# Patient Record
Sex: Male | Born: 1955 | Race: White | Hispanic: No | Marital: Married | Smoking: Never smoker
Health system: Southern US, Community
[De-identification: ages and names within clinical notes are randomized; demographics above are authoritative.]

## PROBLEM LIST (undated history)

## (undated) DIAGNOSIS — Z86010 Personal history of colonic polyps: Secondary | ICD-10-CM

## (undated) DIAGNOSIS — Q211 Atrial septal defect: Secondary | ICD-10-CM

## (undated) DIAGNOSIS — F329 Major depressive disorder, single episode, unspecified: Secondary | ICD-10-CM

## (undated) DIAGNOSIS — D72819 Decreased white blood cell count, unspecified: Secondary | ICD-10-CM

## (undated) DIAGNOSIS — F32A Depression, unspecified: Secondary | ICD-10-CM

## (undated) DIAGNOSIS — Q2112 Patent foramen ovale: Secondary | ICD-10-CM

## (undated) DIAGNOSIS — I639 Cerebral infarction, unspecified: Secondary | ICD-10-CM

## (undated) DIAGNOSIS — T7840XA Allergy, unspecified, initial encounter: Secondary | ICD-10-CM

## (undated) HISTORY — DX: Patent foramen ovale: Q21.12

## (undated) HISTORY — DX: Allergy, unspecified, initial encounter: T78.40XA

## (undated) HISTORY — PX: COLONOSCOPY: SHX174

## (undated) HISTORY — DX: Atrial septal defect: Q21.1

## (undated) HISTORY — DX: Decreased white blood cell count, unspecified: D72.819

## (undated) HISTORY — DX: Depression, unspecified: F32.A

## (undated) HISTORY — DX: Cerebral infarction, unspecified: I63.9

## (undated) HISTORY — DX: Personal history of colonic polyps: Z86.010

## (undated) HISTORY — DX: Major depressive disorder, single episode, unspecified: F32.9

---

## 2002-12-19 ENCOUNTER — Encounter: Payer: Self-pay | Admitting: Internal Medicine

## 2004-02-18 ENCOUNTER — Encounter: Payer: Self-pay | Admitting: Internal Medicine

## 2005-07-14 ENCOUNTER — Ambulatory Visit: Payer: Self-pay | Admitting: Internal Medicine

## 2005-07-22 ENCOUNTER — Ambulatory Visit: Payer: Self-pay | Admitting: Internal Medicine

## 2007-02-01 DIAGNOSIS — F339 Major depressive disorder, recurrent, unspecified: Secondary | ICD-10-CM

## 2007-06-08 ENCOUNTER — Ambulatory Visit: Payer: Self-pay | Admitting: Internal Medicine

## 2007-06-08 LAB — CONVERTED CEMR LAB
Albumin: 4.3 g/dL (ref 3.5–5.2)
Alkaline Phosphatase: 54 units/L (ref 39–117)
BUN: 16 mg/dL (ref 6–23)
Basophils Absolute: 0 10*3/uL (ref 0.0–0.1)
Cholesterol: 185 mg/dL (ref 0–200)
Creatinine, Ser: 1.3 mg/dL (ref 0.4–1.5)
Eosinophils Absolute: 0.2 10*3/uL (ref 0.0–0.6)
GFR calc Af Amer: 74 mL/min
GFR calc non Af Amer: 61 mL/min
Glucose, Urine, Semiquant: 100
HDL: 44.9 mg/dL (ref >39.0)
Hemoglobin: 14.5 g/dL (ref 13.0–17.0)
Ketones, urine, test strip: NEGATIVE
LDL Cholesterol: 130 mg/dL — ABNORMAL HIGH (ref 0–99)
MCHC: 35.2 g/dL (ref 30.0–36.0)
MCV: 91 fL (ref 78.0–100.0)
Monocytes Absolute: 0.3 10*3/uL (ref 0.2–0.7)
Monocytes Relative: 7.8 % (ref 3.0–11.0)
Neutrophils Relative %: 65.1 % (ref 43.0–77.0)
PSA: 0.58 ng/mL (ref 0.10–4.00)
Potassium: 5.3 meq/L — ABNORMAL HIGH (ref 3.5–5.1)
RBC: 4.53 M/uL (ref 4.22–5.81)
RDW: 12.1 % (ref 11.5–14.6)
Sodium: 141 meq/L (ref 135–145)
TSH: 1.01 microintl units/mL (ref 0.35–5.50)
Total Bilirubin: 1.2 mg/dL (ref 0.3–1.2)
Total CHOL/HDL Ratio: 4.1
Urobilinogen, UA: 1
pH: 6

## 2007-06-24 ENCOUNTER — Ambulatory Visit: Payer: Self-pay | Admitting: Internal Medicine

## 2008-08-01 ENCOUNTER — Telehealth: Payer: Self-pay | Admitting: Internal Medicine

## 2008-09-18 ENCOUNTER — Ambulatory Visit: Payer: Self-pay | Admitting: Internal Medicine

## 2008-09-18 LAB — CONVERTED CEMR LAB
Albumin: 4 g/dL (ref 3.5–5.2)
Basophils Absolute: 0 10*3/uL (ref 0.0–0.1)
Basophils Relative: 0.8 % (ref 0.0–3.0)
Bilirubin, Direct: 0.1 mg/dL (ref 0.0–0.3)
Blood in Urine, dipstick: NEGATIVE
CO2: 30 meq/L (ref 19–32)
Calcium: 9.1 mg/dL (ref 8.4–10.5)
Chloride: 111 meq/L (ref 96–112)
Cholesterol: 181 mg/dL (ref 0–200)
Creatinine, Ser: 1.2 mg/dL (ref 0.4–1.5)
Eosinophils Absolute: 0.3 10*3/uL (ref 0.0–0.7)
Glucose, Bld: 97 mg/dL (ref 70–99)
Glucose, Urine, Semiquant: NEGATIVE
HDL: 41 mg/dL (ref >39.00)
MCHC: 35.3 g/dL (ref 30.0–36.0)
MCV: 92.4 fL (ref 78.0–100.0)
Monocytes Absolute: 0.3 10*3/uL (ref 0.1–1.0)
Neutro Abs: 2 10*3/uL (ref 1.4–7.7)
Neutrophils Relative %: 59.4 % (ref 43.0–77.0)
Nitrite: NEGATIVE
PSA: 0.55 ng/mL (ref 0.10–4.00)
RBC: 4.35 M/uL (ref 4.22–5.81)
RDW: 12 % (ref 11.5–14.6)
Specific Gravity, Urine: 1.025
Total CHOL/HDL Ratio: 4
Total Protein: 6.6 g/dL (ref 6.0–8.3)
Triglycerides: 62 mg/dL (ref 0.0–149.0)
pH: 6

## 2008-09-25 ENCOUNTER — Ambulatory Visit: Payer: Self-pay | Admitting: Internal Medicine

## 2009-11-07 ENCOUNTER — Telehealth: Payer: Self-pay | Admitting: Internal Medicine

## 2010-04-16 ENCOUNTER — Ambulatory Visit: Payer: Self-pay | Admitting: Internal Medicine

## 2010-04-16 LAB — CONVERTED CEMR LAB
ALT: 13 units/L (ref 0–53)
AST: 17 units/L (ref 0–37)
Albumin: 4.2 g/dL (ref 3.5–5.2)
Alkaline Phosphatase: 54 units/L (ref 39–117)
Basophils Relative: 1.3 % (ref 0.0–3.0)
Bilirubin Urine: NEGATIVE
Calcium: 9.2 mg/dL (ref 8.4–10.5)
Cholesterol: 167 mg/dL (ref 0–200)
Creatinine, Ser: 1.3 mg/dL (ref 0.40–1.50)
Eosinophils Absolute: 0.3 10*3/uL (ref 0.0–0.7)
Glucose, Urine, Semiquant: NEGATIVE
HDL: 45 mg/dL (ref >39)
MCHC: 35.1 g/dL (ref 30.0–36.0)
MCV: 93.7 fL (ref 78.0–100.0)
Monocytes Absolute: 0.3 10*3/uL (ref 0.1–1.0)
Neutrophils Relative %: 63.2 % (ref 43.0–77.0)
RBC: 3.98 M/uL — ABNORMAL LOW (ref 4.22–5.81)
RDW: 12.9 % (ref 11.5–14.6)
Sodium: 143 meq/L (ref 135–145)
Specific Gravity, Urine: 1.02
Total Bilirubin: 0.5 mg/dL (ref 0.3–1.2)
Total Protein: 6.2 g/dL (ref 6.0–8.3)
Triglycerides: 65 mg/dL (ref <150)
WBC Urine, dipstick: NEGATIVE
pH: 6

## 2010-04-23 ENCOUNTER — Ambulatory Visit: Payer: Self-pay | Admitting: Internal Medicine

## 2010-06-24 NOTE — Progress Notes (Signed)
Summary: paxil--needs ov  Phone Note Refill Request Call back at [phone] Message from:  spouse--live call  Refills Requested: Medication #1:  PAXIL CR 25 MG TB24 Take 1 tablet by mouth once a day send to [facility] order.  Initial call taken by: Warnell Forester,  November 07, 2009 9:13 AM  Follow-up for Phone Call        see Rx.Patient wife notified.   Knows he needs ov for additional. Follow-up by: Gladis Riffle, RN,  November 07, 2009 11:22 AM    New/Updated Medications: PAXIL CR 25 MG TB24 (PAROXETINE HCL) Take 1 tablet by mouth once a day--NEEDS OFFICE VISIT FOR ADDITIONAL REFILLS Prescriptions: PAXIL CR 25 MG TB24 (PAROXETINE HCL) Take 1 tablet by mouth once a day--NEEDS OFFICE VISIT FOR ADDITIONAL REFILLS  #90 x 1   Entered by:   Gladis Riffle, RN   Authorized by:   Birdie Sons MD   Signed by:   Gladis Riffle, RN on 11/07/2009   Method used:   Faxed to ...       [address] [facility] (mail-order)       [address]       [place], [place]  [address]       Ph: [phone]       Fax: [phone]   RxID:   [id number]

## 2010-06-24 NOTE — Assessment & Plan Note (Signed)
Summary: cpx//ccm   Vital Signs:  Patient profile:   55 year old male Height:      68 inches Weight:      164 pounds BMI:     25.03 Temp:     98.4 degrees F oral Pulse rate:   72 / minute Pulse rhythm:   regular BP sitting:   118 / 78  (left arm) Cuff size:   regular  Vitals Entered By: Alfred Levins, CMA (April 23, 2010 9:50 AM) CC: cpx   CC:  cpx.  History of Present Illness: cpx  Current Problems (verified): 1)  Preventive Health Care  (ICD-V70.0) 2)  Depression  (ICD-311)  Current Medications (verified): 1)  Paxil Cr 25 Mg Tb24 (Paroxetine Hcl) .... Take 1 Tablet By Mouth Once A Day--Needs Office Visit For Additional Refills 2)  Loratadine 10 Mg  Tabs (Loratadine) .... Once Daily 3)  Alprazolam 0.25 Mg Tabs (Alprazolam) .... Take 1 Tablet By Mouth Once A Day As Needed  Allergies (verified): No Known Drug Allergies  Past History:  Past Medical History: Last updated: 06/24/2007 Allergic rhinitis Depression chronic leukopenia.   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 HEENT exam atraumatic, normocephalic symmetric her muscles are intact. Neck is supple without lymphadenopathy or thyromegaly. Chest clear to auscultation. Cardiac exam S1-S2 are regular. Abdominal exam active bowel sounds, soft, nontender. No hepatosplenomegaly. Extremities no clubbing cyanosis or edema. Peripheral pulses are normal. Neurologic exam is alert and oriented without any motor or sensory deficits.   Impression & Recommendations:  Problem # 1:  PREVENTIVE HEALTH CARE (ICD-V70.0) health maintenance up-to-date. I've advised him to participate in regular aerobic exercise. Laboratory work reviewed with him. Colonoscopy up-to-date. That was reviewed. He'll see him in one year. Prescription refills.  Complete Medication List: 1)  Paxil Cr 25 Mg Tb24 (Paroxetine hcl) .... Take 1 tablet by mouth once a day--needs office visit for additional refills 2)  Loratadine 10 Mg Tabs (Loratadine) .... Once daily 3)  Alprazolam 0.5 Mg Tabs (Alprazolam) .... Take 1 tablet by mouth once a day as needed for situational anxiety Prescriptions: ALPRAZOLAM 0.5 MG  TABS (ALPRAZOLAM) Take 1 tablet by mouth once a day as needed for situational anxiety  #30 x 1   Entered and Authorized by:   Birdie Sons MD   Signed by:   Birdie Sons MD on 04/23/2010   Method used:   Print then Give to Patient   RxID:   [id number] PAXIL CR 25 MG TB24 (PAROXETINE HCL) Take 1 tablet by mouth once a day--NEEDS OFFICE VISIT FOR ADDITIONAL REFILLS  #90 x 3   Entered by:   Alfred Levins, CMA   Authorized by:  Birdie Sons MD   Signed by:   Alfred Levins, CMA on 04/23/2010   Method used:   Electronically to        [facility] [address] (mail-order)       [facility]       [address]       [place], [place]  [address]       Ph: [phone]       Fax: [phone]    RxID:   [id number]    Orders Added: 1)  Est. Patient 40-64 years [[id number]]  Appended Document: cpx//ccm    Prescriptions: PAXIL CR 25 MG TB24 (PAROXETINE HCL) Take 1 tablet by mouth once a day--NEEDS OFFICE VISIT FOR ADDITIONAL REFILLS  #90 x 3   Entered by:   Alfred Levins, CMA   Authorized by:   Birdie Sons MD   Signed by:   Alfred Levins, CMA on 04/23/2010   Method used:   Faxed to ...       [address] [facility] (mail-order)       [address]       [place], [place]  [address]       Ph: [phone]       Fax: [phone]   RxID:   [id number]    pt no longer with [facility], faxed to correct one

## 2011-03-21 ENCOUNTER — Inpatient Hospital Stay (HOSPITAL_COMMUNITY)
Admission: EM | Admit: 2011-03-21 | Discharge: 2011-03-23 | DRG: 065 | Disposition: A | Discharge status: Home / Self Care | Payer: Managed Care, Other (non HMO) | Attending: Internal Medicine | Admitting: Internal Medicine

## 2011-03-21 ENCOUNTER — Encounter: Payer: Self-pay | Admitting: Internal Medicine

## 2011-03-21 ENCOUNTER — Encounter: Payer: Self-pay | Admitting: Family Medicine

## 2011-03-21 ENCOUNTER — Emergency Department (HOSPITAL_COMMUNITY): Payer: Managed Care, Other (non HMO)

## 2011-03-21 ENCOUNTER — Ambulatory Visit (INDEPENDENT_AMBULATORY_CARE_PROVIDER_SITE_OTHER): Payer: Managed Care, Other (non HMO) | Admitting: Family Medicine

## 2011-03-21 ENCOUNTER — Observation Stay (HOSPITAL_COMMUNITY): Payer: Managed Care, Other (non HMO)

## 2011-03-21 DIAGNOSIS — E785 Hyperlipidemia, unspecified: Secondary | ICD-10-CM | POA: Diagnosis present

## 2011-03-21 DIAGNOSIS — I6340 Cerebral infarction due to embolism of unspecified cerebral artery: Principal | ICD-10-CM | POA: Diagnosis present

## 2011-03-21 DIAGNOSIS — Q211 Atrial septal defect: Secondary | ICD-10-CM

## 2011-03-21 DIAGNOSIS — Z79899 Other long term (current) drug therapy: Secondary | ICD-10-CM

## 2011-03-21 DIAGNOSIS — T7840XA Allergy, unspecified, initial encounter: Secondary | ICD-10-CM | POA: Diagnosis present

## 2011-03-21 DIAGNOSIS — Q2111 Secundum atrial septal defect: Secondary | ICD-10-CM

## 2011-03-21 DIAGNOSIS — Z823 Family history of stroke: Secondary | ICD-10-CM

## 2011-03-21 DIAGNOSIS — X58XXXA Exposure to other specified factors, initial encounter: Secondary | ICD-10-CM

## 2011-03-21 DIAGNOSIS — R55 Syncope and collapse: Secondary | ICD-10-CM

## 2011-03-21 DIAGNOSIS — Z23 Encounter for immunization: Secondary | ICD-10-CM

## 2011-03-21 DIAGNOSIS — F410 Panic disorder [episodic paroxysmal anxiety] without agoraphobia: Secondary | ICD-10-CM | POA: Diagnosis present

## 2011-03-21 LAB — DIFFERENTIAL
Basophils Absolute: 0 10*3/uL (ref 0.0–0.1)
Eosinophils Relative: 5 % (ref 0–5)
Lymphocytes Relative: 22 % (ref 12–46)
Lymphs Abs: 1 10*3/uL (ref 0.7–4.0)
Monocytes Absolute: 0.4 10*3/uL (ref 0.1–1.0)
Monocytes Relative: 8 % (ref 3–12)
Neutro Abs: 3.1 10*3/uL (ref 1.7–7.7)

## 2011-03-21 LAB — RAPID URINE DRUG SCREEN, HOSP PERFORMED
Amphetamines: NOT DETECTED
Barbiturates: NOT DETECTED
Benzodiazepines: NOT DETECTED
Tetrahydrocannabinol: NOT DETECTED

## 2011-03-21 LAB — URINALYSIS, ROUTINE W REFLEX MICROSCOPIC
Glucose, UA: NEGATIVE mg/dL
Ketones, ur: NEGATIVE mg/dL
Leukocytes, UA: NEGATIVE
Nitrite: NEGATIVE
Protein, ur: NEGATIVE mg/dL

## 2011-03-21 LAB — COMPREHENSIVE METABOLIC PANEL
ALT: 17 U/L (ref 0–53)
CO2: 26 mEq/L (ref 19–32)
Calcium: 9.6 mg/dL (ref 8.4–10.5)
GFR calc Af Amer: >90 mL/min (ref >90)
GFR calc non Af Amer: 79 mL/min — ABNORMAL LOW (ref >90)
Glucose, Bld: 86 mg/dL (ref 70–99)
Sodium: 139 mEq/L (ref 135–145)

## 2011-03-21 LAB — CK TOTAL AND CKMB (NOT AT ARMC): CK, MB: 2.7 ng/mL (ref 0.3–4.0)

## 2011-03-21 LAB — CBC
Platelets: 240 10*3/uL (ref 150–400)
RDW: 12.5 % (ref 11.5–15.5)
WBC: 4.7 10*3/uL (ref 4.0–10.5)

## 2011-03-21 LAB — HEMOGLOBIN A1C: Hgb A1c MFr Bld: 5.1 % (ref <5.7)

## 2011-03-21 LAB — POCT I-STAT TROPONIN I

## 2011-03-21 NOTE — Assessment & Plan Note (Addendum)
Vs. TIA vs CVA vs SZ.  D/w pt.  I am unclear about the source and he need ER eval. I called [facility] ER and they are aware of pending arrival. >25 min spent with face to face with patient, >50% counseling and/or coordinating care.

## 2011-03-21 NOTE — Patient Instructions (Signed)
Go to the [facility] ER.  Don't drive yourself.

## 2011-03-21 NOTE — Progress Notes (Signed)
Was in the shower yesterday.  He remembers being in the shower and trying to wash his hair.  He fell, had trouble getting up, fell again in shower.  He remembers having a severe headache. Got out of shower and dried off, then went to get ibuprofen for HA.  He was fatigued.  He laid down, fell asleep.  When he woke, he had trouble remembering events from the shower.  "I didn't know if it was a dream."  Went to work yesterday.  Noted that his writing was changed yesterday.  He couldn't write clear letters.    Today's he's sore on the L side of his body.  His writing is back to baseline per his report.    Father had a CVA/TIA.  Meds, vitals, and allergies reviewed.   ROS: See HPI.  Otherwise, noncontributory.  Past Medical History  Diagnosis Date  . Allergic rhinitis   . Depression   . Leukopenia     chronic   Meds: xanax, claritin and paxil.    NKDA  GEN: nad, alert and oriented HEENT: mucous membranes moist NECK: supple w/o LA CV: rrr. PULM: ctab, no inc wob ABD: soft, +bs EXT: no edema SKIN: no acute rash CN 2-12 wnl B, S/S/DTR wnl

## 2011-03-22 ENCOUNTER — Inpatient Hospital Stay (HOSPITAL_COMMUNITY): Payer: Managed Care, Other (non HMO)

## 2011-03-22 LAB — BASIC METABOLIC PANEL
BUN: 16 mg/dL (ref 6–23)
CO2: 27 mEq/L (ref 19–32)
Calcium: 9.3 mg/dL (ref 8.4–10.5)
GFR calc non Af Amer: 72 mL/min — ABNORMAL LOW (ref >90)
Glucose, Bld: 94 mg/dL (ref 70–99)

## 2011-03-22 LAB — CBC
HCT: 36.9 % — ABNORMAL LOW (ref 39.0–52.0)
Hemoglobin: 13.1 g/dL (ref 13.0–17.0)
MCH: 31.3 pg (ref 26.0–34.0)
MCHC: 35.5 g/dL (ref 30.0–36.0)
MCV: 88.1 fL (ref 78.0–100.0)

## 2011-03-22 LAB — DIFFERENTIAL
Lymphocytes Relative: 27 % (ref 12–46)
Lymphs Abs: 1.2 10*3/uL (ref 0.7–4.0)
Monocytes Absolute: 0.4 10*3/uL (ref 0.1–1.0)
Monocytes Relative: 9 % (ref 3–12)
Neutro Abs: 2.3 10*3/uL (ref 1.7–7.7)

## 2011-03-22 LAB — LIPID PANEL
LDL Cholesterol: 116 mg/dL — ABNORMAL HIGH (ref 0–99)
VLDL: 26 mg/dL (ref 0–40)

## 2011-03-22 LAB — CK TOTAL AND CKMB (NOT AT ARMC): CK, MB: 2.7 ng/mL (ref 0.3–4.0)

## 2011-03-22 LAB — TSH: TSH: 2.053 u[IU]/mL (ref 0.350–4.500)

## 2011-03-22 LAB — C-REACTIVE PROTEIN: CRP: 0.09 mg/dL — ABNORMAL LOW (ref <0.60)

## 2011-03-22 LAB — GLUCOSE, CAPILLARY: Glucose-Capillary: 89 mg/dL (ref 70–99)

## 2011-03-22 MED ORDER — IOHEXOL 350 MG/ML SOLN
50.0000 mL | Freq: Once | INTRAVENOUS | Status: AC | PRN
  Administered 2011-03-22: 50 mL via INTRAVENOUS

## 2011-03-22 NOTE — H&P (Signed)
NAMEMarland Kitchen  Ryan Harris, Ryan Harris NO.:  [id number]  MEDICAL RECORD NO.:  [id number]  LOCATION:  1839                         FACILITY:  [facility]  PHYSICIAN:  Osvaldo Shipper, MD     DATE OF BIRTH:  Oct 03, 1955  DATE OF ADMISSION:  03/21/2011 DATE OF DISCHARGE:                             HISTORY & PHYSICAL   PRIMARY CARE PHYSICIAN:  Valetta Mole. Swords, MD  ADMISSION DIAGNOSES: 1. Right brain stroke, likely embolic. 2. History of anxiety disorder and panic attacks. 3. Allergies.  CHIEF COMPLAINT:  Headache and falls yesterday.  HISTORY OF PRESENT ILLNESS:  The patient is a 55 year old Caucasian male with no real medical problems who was in his usual state of health until yesterday morning when he got up to go to work, started having headache. He went into shower and then he apparently had a fall.  He got up and then he had another fall.  He felt extremely fatigued and tired.  Took ibuprofen for his headache and went to bed and he must have dozed off according to him.  He woke up and could not really recall if the falls really happened or not.  He subsequently went on to go to work and he found that he was not able to write properly.  His handwriting was very scribbly.  He is left handed.  The rest of the day went okay.  He came back home.  He slept in the night.  He woke up this morning, mentioned all this to his wife who recommended that he go to see his doctor.  He went to the [facility] who promptly sent him to the emergency department for further evaluation.  Currently, he denies any headaches. He denies any chest pain, shortness of breath, fever, chills, nausea, vomiting.  He did not have any weakness on one side or the other.  He denies any tingling or numbness.  His handwriting is back to normal.  He denies any weight loss.  He denies any diarrhea or hair loss.  He denies any seizure-type activity.  He has never had similar symptoms in the past.  MEDICATIONS AT  HOME: 1. Claritin 10 mg daily. 2. Paxil, he tells me he is on 25 mg daily. 3. He takes ibuprofen occasionally for headaches. 4. He is not on aspirin.  ALLERGIES:  No known drug allergies.  PAST MEDICAL HISTORY:  Positive for anxiety with panic attacks, and history of allergies.  PAST SURGICAL HISTORY:  Unremarkable.  SOCIAL HISTORY:  He lives in [place] with his family.  He owns a sand and gravel company.  He denies any smoking use.  Social alcohol intake.  No illicit drug use.  FAMILY HISTORY:  His mother had TIA and hypertension, but she otherwise is alive and well.  Father died of multiple strokes.  He has 2 brothers and 2 sisters, almost of whom have high blood pressure.  REVIEW OF SYSTEMS:  GENERAL SYSTEM:  Positive for weakness, malaise. HEENT:  Unremarkable at this time.  CARDIOVASCULAR:  Unremarkable. RESPIRATORY:  Unremarkable.  GI:  Unremarkable.  GU:  Unremarkable. NEUROLOGIC:  As in HPI.  PSYCHIATRIC:  Unremarkable.  DERMATOLOGIC: Unremarkable.  Other systems  were reviewed and found to be negative.  PHYSICAL EXAMINATION:  VITAL SIGNS:  Temperature 97.6, blood pressure initially when he came in was 159/90, currently 136/88, heart rate 68, respiratory rate 20, saturation 100% on room air. GENERAL:  This is a thin white male, in no distress. HEENT:  Head is normocephalic, atraumatic.  Pupils are equal and reacting.  No pallor.  No icterus.  Oral mucous membrane is moist.  No oral lesions are noted. NECK:  Soft and supple.  No thyromegaly is appreciated. LUNGS:  Clear to auscultation bilaterally with no wheezing, rales, or rhonchi. CARDIOVASCULAR:  S1 and S2 is normal, regular.  No S3, S4, rubs, murmurs, or bruits. ABDOMEN:  Soft, nontender, nondistended.  Bowel sounds present.  No masses or organomegaly is appreciated.  No cervical, supraclavicular, inguinal lymphadenopathy is present. GU:  Deferred. MUSCULOSKELETAL:  Normal muscle mass and tone. NEUROLOGIC:   He is alert and oriented x3.  No cranial nerve deficits. No motor strength deficits.  No sensory deficits.  No cerebellar signs abnormal was noted.  No pronator drift.  Finger-to-nose was normal. Gait was not assessed.  However, he did tell me that he has had no problems with ambulation today. SKIN:  Does not reveal any rashes.  He does have some minor bruising from the fall, but nothing significant.  LAB DATA:  His CBC is unremarkable.  Coags are normal.  Electrolytes are normal.  HB A1c is 5.1.  His CK is 237.  Urine drug screen was negative. Alcohol less than 11.  UA was unremarkable.  Cardiac enzymes were negative.  Otherwise, he had multiple testing done. 1. CT of the head which was nonacute. 2. Chest x-ray which was again nothing active. 3. MRI of the brain showed multiple small acute infarct in the right     MCA and the right PCA territories.  No mass effect.  MRA of the     head that was done which showed a fetal-type PCA origins     bilaterally, explaining the acute ischemic findings in both right     MCA and PCA territories on the basis of right carotid     thromboembolic disease.  No anterior circulation stenosis.  No     right MCA or PCA major branch occlusion.  Diminutive posterior     circulation was seen.  Stenosis or fenestration in the distal left     vertebral artery beyond the origin of the left PICA.  He had an EKG     done which shows a sinus bradycardia at 52 with normal axis,     intervals appeared to be in the normal range.  No concerning ST     changes or T-wave changes.  No Q-waves are noted on this EKG. 4. He has also had an echocardiogram, the results of which are     available and it showed an EF of 60-65% without any     wall motion abnormalities.  No apical clot was mentioned on this 2D     echocardiogram.  ASSESSMENT:  This is a 55 year old Caucasian male with no medical problems who presents after headache and falls yesterday and is found to have  stroke on the right side of the brain, thought to be embolic stroke, could be from carotid origin, could be cardiac as well.  PLAN: 1. Right brain stroke.  Neurology has seen the patient.  Workup is in     progress.  Aspirin will be given.  Neurology will decide  on a TEE     tomorrow.  The patient does not have any focal deficits at this     time.  Other workup has been recommended by Neurology which we will     order. 2. Lipid panel will be checked in the morning as well. 3. Continue with Paxil for his anxiety disorder.  Continue with     Claritin for his history of allergies. 4. DVT prophylaxis will be initiated.  Further management decisions will depend on results of further testing and the patient's response to treatment.  Osvaldo Shipper, MD     GK/MEDQ  D:  03/21/2011  T:  03/21/2011  Job:  [id number]  cc:   Valetta Mole. Swords, MD  Electronically Signed by Osvaldo Shipper MD on 03/22/2011 10:13:56 PM

## 2011-03-23 DIAGNOSIS — I6789 Other cerebrovascular disease: Secondary | ICD-10-CM

## 2011-03-23 DIAGNOSIS — G459 Transient cerebral ischemic attack, unspecified: Secondary | ICD-10-CM

## 2011-03-23 LAB — GLUCOSE, CAPILLARY
Glucose-Capillary: 102 mg/dL — ABNORMAL HIGH (ref 70–99)
Glucose-Capillary: 83 mg/dL (ref 70–99)
Glucose-Capillary: 77 mg/dL (ref 70–99)

## 2011-03-23 LAB — PROTEIN C ACTIVITY: Protein C Activity: 131 % (ref 75–133)

## 2011-03-23 LAB — LUPUS ANTICOAGULANT PANEL: PTT Lupus Anticoagulant: 35.1 secs (ref 28.0–43.0)

## 2011-03-23 LAB — PROTEIN S ACTIVITY: Protein S Activity: 87 % (ref 69–129)

## 2011-03-23 NOTE — Consult Note (Signed)
NAMEMarland Harris  ARYAMAN, HALIBURTON NO.:  [id number]  MEDICAL RECORD NO.:  [id number]  LOCATION:  1839                         FACILITY:  [facility]  PHYSICIAN:  Lajuana Carry, MD       DATE OF BIRTH:  Jan 22, 1956  DATE OF CONSULTATION:  03/21/2011 DATE OF DISCHARGE:                                CONSULTATION   REFERRING PHYSICIAN:  Whitney Plunkett  CHIEF COMPLAINT:  Confusion.  HISTORY OF PRESENT ILLNESS:  Ryan Harris is a 55 year old left-handed male who presents to the emergency department today after an event which occurred yesterday.  The patient states that yesterday while he was in the shower, he developed a severe holocephalic headache.  After this headache occurred, he fell twice to the floor while he was in the shower.  He thought this was due to excess shampoo, which fell on the floor and caused him to slip.  After he got out of the shower, he cleaned himself up and took Motrin and then went to take a nap.  The patient slept for approximately for an hour and upon awakening, he did not quite remember everything that he had done.  The memories of the shower were quite fuzzy.  Throughout the rest of the day, he seemed to be okay though.  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  He noticed his handwriting was very illegible.  The handwriting was sloppy and it was noted that he was copying incorrect letters or words.  He consciously made an effort to work on improving his handwriting throughout the day and it did improve.  Due to these symptoms, he presented to the emergency department for evaluation.  At this current time, the patient feels okay now.  He does mention over the past couple of months, he has had some episodes of lightheadedness and feeling like going to pass out.  He has not had any palpitations, chest pain, shortness of breath, cough, vertigo, abdominal pain, rashes, incontinence, tongue biting, or lower extremity edema.  PAST  MEDICAL HISTORY:  The patient denies any history of hypertension, diabetes, and hyperlipidemia.  PAST SURGICAL HISTORY:  No prior surgeries.  MEDICATIONS: 1. Paxil 25 mg daily. 2. Claritin.  ALLERGIES:  There are no known drug allergies.  SOCIAL HISTORY:  The patient does not smoke, drink, or use any illicit drugs.  FAMILY HISTORY:  The patient's parents both suffered from hypertension. The patient's mother had multiple TIAs, and his father passed away after multiple strokes.  REVIEW OF SYSTEMS:  A complete review of systems was performed and were negative except for that which was listed above.  PHYSICAL EXAMINATION:  VITAL SIGNS:  Pulse of 63, blood pressure of 132/71, respirations 12, and oxygen saturations 100%. GENERAL:  The patient was in no apparent distress.  He was well nourished and well developed. HEENT:  There was no lymphadenopathy and his oropharynx was clear. CARDIOVASCULAR:  The patient had a regular rate and rhythm without murmur.  There were no carotid bruits auscultated. RESPIRATORY:  The patient's lungs were clear to auscultation bilaterally. ABDOMEN:  Soft, nontender, nondistended with normal bowel sounds. EXTREMITIES:  There was no cyanosis, clubbing or  edema. MENTAL STATUS:  The patient was awake, alert, and oriented x3.  His speech was clear and nondysarthric.  His memory, concentration, and attention were intact.  His fund of knowledge was appropriate. CRANIAL NERVES:  Cranial nerve II, visual fields were intact to confrontation and funduscopic exam was unremarkable.  Cranial nerves III, IV, and VI, extraocular movements were intact.  Pupils were equal, round, reactive to light and accommodation.  Cranial nerve V, facial sensation was intact bilaterally.  Cranial nerve VII, facial strength was symmetrical bilaterally.  Cranial nerve VIII, auditory acuity was intact bilaterally.  Cranial nerve IX and X, the palate and uvula were midline and elevated  symmetrically.  Cranial nerve XI, sternocleidomastoid strength and trapezius strength was 5/5.  Cranial nerve XII, tongue protruded in the midline.  There is no atrophy or fasciculations.  MOTOR: the patient could generate 5/5 strength in his bilateral upper and lower extremities and muscle groups proximal and distal. There was no pronator drift.  The patient's tone was normal and there was no atrophy.  REFLEXES: were 2+ and symmetric in the bilateral upper and lower extremities.  COORDINATION: he had intact finger-to-nose, heel-to-shin and rapid alternating movements bilaterally.  SENSATION: was intact to light touch and pinprick and there was no sensory extension.  GAIT: routine gait was normal.  LAB WORK:  A CBC, CMP, urinalysis, troponin, CK, urine drug screen and alcohol level were all checked on this patient on March 21, 2011. There were all within normal limits.  RADIOLOGY:  MRI of the brain was performed and demonstrated multiple small acute infarct, scattered in the right MCA and right PCA territories.  The MRA of the brain demonstrated no major branch occlusions and no anterior circulation stenosis.  ASSESSMENT:  Ryan Harris is a 55 year old male presenting with an episode of mild confusion and difficulty with handwriting, clinically now appears to be asymptomatic.  His imaging does demonstrate multiple small acute ischemic infarcts in his right middle cerebral artery and posterior cerebral artery territories.  The imaging also suggest that he has a right-sided fetal posterior cerebral artery.  The strokes are likely embolic in nature and given the fetal posterior cerebral artery, this could be from the right carotid artery or from cardiac in nature. Given that the patient has had symptoms that are concerning for cardiac disease with lightheadedness and dizziness, I suspect that this is the most likely cause.  There are rare causes of multiple small infarcts which  could include vasculitis and syphilis and these could be screened.  PLAN: 1. Recommend medicine admission for cardiac evaluation. 2. Recommend monitoring on telemetry and cycling cardiac enzymes. 3. Obtain a carotid ultrasound and transthoracic echocardiogram.  He     may need a transesophageal echogram if the TTE is normal.  Please     place him on an 81 mg of aspirin overnight.  Please check a     hemoglobin A1c, TSH, fasting lipid profile, ESR, CRP and an RPR.          ______________________________ Lajuana Carry, MD     DM/MEDQ  D:  03/21/2011  T:  03/21/2011  Job:  [id number]  Electronically Signed by Lajuana Carry MD on 03/23/2011 06:56:24 AM

## 2011-03-24 ENCOUNTER — Ambulatory Visit: Payer: Self-pay | Admitting: Internal Medicine

## 2011-03-24 LAB — C3 COMPLEMENT: C3 Complement: 97 mg/dL (ref 90–180)

## 2011-03-24 LAB — CARDIOLIPIN ANTIBODIES, IGG, IGM, IGA: Anticardiolipin IgM: 2 MPL U/mL — ABNORMAL LOW (ref <11)

## 2011-03-24 LAB — BETA-2-GLYCOPROTEIN I ABS, IGG/M/A: Beta-2 Glyco I IgG: 0 G Units (ref <20)

## 2011-03-25 LAB — PROTEIN C, TOTAL: Protein C, Total: 84 % (ref 72–160)

## 2011-03-25 LAB — COMPLEMENT, TOTAL: Compl, Total (CH50): 57 U/mL (ref 31–60)

## 2011-03-27 ENCOUNTER — Ambulatory Visit (INDEPENDENT_AMBULATORY_CARE_PROVIDER_SITE_OTHER): Payer: Managed Care, Other (non HMO) | Admitting: Internal Medicine

## 2011-03-27 ENCOUNTER — Encounter: Payer: Self-pay | Admitting: Internal Medicine

## 2011-03-27 DIAGNOSIS — Q2111 Secundum atrial septal defect: Secondary | ICD-10-CM

## 2011-03-27 DIAGNOSIS — I639 Cerebral infarction, unspecified: Secondary | ICD-10-CM | POA: Insufficient documentation

## 2011-03-27 DIAGNOSIS — Q2112 Patent foramen ovale: Secondary | ICD-10-CM | POA: Insufficient documentation

## 2011-03-27 DIAGNOSIS — I6350 Cerebral infarction due to unspecified occlusion or stenosis of unspecified cerebral artery: Secondary | ICD-10-CM

## 2011-03-27 DIAGNOSIS — Q211 Atrial septal defect: Secondary | ICD-10-CM | POA: Insufficient documentation

## 2011-03-27 MED ORDER — SIMVASTATIN 10 MG PO TABS: 10.0000 mg | ORAL_TABLET | Freq: Every day | ORAL | Status: DC

## 2011-03-27 NOTE — Assessment & Plan Note (Addendum)
Reviewed hospital records He is doing well Has f/u with neurology and cardiology He is interested in understanding future risks given known PFO

## 2011-03-27 NOTE — Assessment & Plan Note (Signed)
Pt considering joining a study Would like to speak with CV

## 2011-03-28 NOTE — Discharge Summary (Signed)
Ryan, Harris               ACCOUNT NO.:  [id number]  MEDICAL RECORD NO.:  [id number]  LOCATION:  3702                         FACILITY:  [facility]  PHYSICIAN:  Erick Blinks, MD     DATE OF BIRTH:  [birth year]  DATE OF ADMISSION:  03/21/2011 DATE OF DISCHARGE:  03/23/2011                              DISCHARGE SUMMARY   DISCHARGE DIAGNOSES: 1. Right brain embolic stroke. 2. Patent foramen ovale found on transesophageal echo. 3. Hyperlipidemia. 4. Seasonal allergies. 5. History of anxiety disorder and panic attacks.  DISCHARGE MEDICATIONS: 1. Aspirin 325 mg p.o. daily. 2. Simvastatin 10 mg p.o. at bedtime. 3. Claritin over the counter 1 tablet p.o. daily p.r.n. for allergies. 4. Paxil CR 25 mg 1 tablet p.o. q.a.m. 5. Ibuprofen 200 mg 2 tablets p.o. q.8 hours p.r.n. for headache.  ADMISSION HISTORY:  This is a 55 year old gentleman who was in his usual state of health, when he got up for work, he started to have a headache.He went into the shower and apparently had a fall.  He got up and then had another fall.  He was extremely fatigued and tired.  He subsequently went on to work and found that he was not able to write properly.  His handwriting had become very scribbly.  He subsequently went to the [facility] to see his primary doctor and he was referred to the emergency room for evaluation.  For details, please refer to the history and physical per Dr. Rito Ehrlich on March 21, 2011.  HOSPITAL COURSE: 1. Right embolic stroke.  The patient was seen by Neurology and he     underwent MRI and MRA of his head, results of which showed multiple     small acute infarcts in the right MCA and right PCA territory.  No     mass effect or hemorrhage.  He was started on aspirin. Due to the     embolic nature of his stroke, he underwent transesophageal     echocardiogram.  Results of which showed no LA thrombus.  It did     show positive patent foreman ovale, but otherwise ejection  fraction     was 55-60%.  This study was unremarkable.  He had a venous Dopplers     which were negative for DVT.  He also had carotid Dopplers done     which showed no ICA stenosis bilaterally and vertebrals were     antegrade.  The patient was started on aspirin 325 mg daily.  His     LDL was elevated at 116, so he was started on Zocor.  His blood     pressure has remained under control.  He has not required any     antihypertensives.  A1c was also checked which was found to be     normal.  Thus far, the syphilis screen, hypercoagulable panel, and     vasculitis panel have been negative, but remainder of the results     will need to be followed up by his primary care physician.  The     patient is ambulating and is currently asymptomatic.  He is     requesting discharge home which seems  to be appropriate.  We will     have him discharge and followup with his primary care physician in     1 week and he will also follow up with the Neurology Service to     discuss IRIS trial regarding his patent foreman ovale.  DISCHARGE INSTRUCTIONS:  The patient should continue on heart-healthy diet, conduct his activity as tolerated.  He should remain off work for the next week.  Follow up with his primary care physician in 1 week's time and we have given him the contact information for [facility] to schedule followup appointment.  CONDITION AT THE TIME OF DISCHARGE:  Improved.  DIAGNOSTIC IMAGING: 1. CT head on admission shows no acute intracranial abnormality.  No     definite acute cortical infarction. 2. Chest x-ray on admission shows no active disease. 3. MRI of the brain shows multiple small acute infarcts scattered in     the right MCA and right PCA territories.  No mass effect or     hemorrhage. 4. MRA shows bilateral fetal-type PCA origins.  The acute ischemic     finding in both of right MCA and PCA territories on the base of the     right carotid thromboembolic disease.  No  anterior circulation     stenosis, no right PCA or MCA major branch occlusion.  Diminutive     posterior circulation stenosis or fenestration in the distal left     vertebral artery beyond the origin of the left PICA. 5. CT angio of the head shows ectasia and tortuosity of the thoracic     aorta.  No demonstrable plaque or brachiocephalic vessel origin     stenosis.  Brachiocephalic vessels appeared normal through the neck     region.  No carotid stenosis or evidence of dissection.  CT head     shows no intracranial stenosis or demonstrable vessel occlusion     fetal origin of both posterior cerebral arteries and anterior     circulation, incidental fenestration of the distal left vertebral     artery. 6. 2D echo without contrast shows no apical clot.  LV showed an     ejection fraction of 60-65%.  Wall motion was normal.  No regional     wall motion abnormality. 7. Transesophageal echo shows ejection fraction of 55-60%.  No left     atrial appendage thrombus.  There was a patent foreman ovale in the     interatrial septum.  CONSULTATIONS:  Neurology, Pramod P. Sethi   Dictation ended at this point.     Erick Blinks, MD    JM/MEDQ  D:  03/23/2011  T:  03/23/2011  Job:  [id number]  Electronically Signed by Durward Mallard MEMON  on 03/28/2011 01:48:30 PM

## 2011-03-30 ENCOUNTER — Ambulatory Visit: Payer: Managed Care, Other (non HMO) | Admitting: Family Medicine

## 2011-03-30 ENCOUNTER — Telehealth: Payer: Self-pay | Admitting: Internal Medicine

## 2011-03-30 NOTE — Telephone Encounter (Signed)
Pts wife called to check on status of Cardiology referral. Pt is needing this asap. Pls call pts wife with referral info.

## 2011-03-31 NOTE — Telephone Encounter (Signed)
This is already taken care of.

## 2011-04-04 NOTE — Progress Notes (Signed)
  Subjective:    Patient ID: Ryan Harris, male    DOB: 1956-05-09, 55 y.o.   MRN: [id number]  HPI  Stroke---comes in after hospitalization He feels well has not complaints Reviewed hospitalziation notes, studies, including imaging studies  Past Medical History  Diagnosis Date  . Allergic rhinitis   . Depression   . Leukopenia     chronic   No past surgical history on file.  reports that he has never smoked. He does not have any smokeless tobacco history on file. He reports that he drinks alcohol. He reports that he does not use illicit drugs. family history includes Coronary artery disease in his father and sister; Heart attack in his mother; Hypertension in his brother; and Stroke in his father. No Known Allergies   Review of Systems  patient denies chest pain, shortness of breath, orthopnea. Denies lower extremity edema, abdominal pain, change in appetite, change in bowel movements. Patient denies rashes, musculoskeletal complaints. No other specific complaints in a complete review of systems.      Objective:   Physical Exam  well-developed well-nourished male in no acute distress. HEENT exam atraumatic, normocephalic, neck supple without jugular venous distention. Chest clear to auscultation cardiac exam S1-S2 are regular. Abdominal exam overweight with bowel sounds, soft and nontender. Extremities no edema. Neurologic exam is alert with a normal gait.    Assessment & Plan:

## 2011-04-13 ENCOUNTER — Ambulatory Visit (INDEPENDENT_AMBULATORY_CARE_PROVIDER_SITE_OTHER): Payer: Managed Care, Other (non HMO) | Admitting: Cardiovascular Disease

## 2011-04-13 ENCOUNTER — Encounter: Payer: Self-pay | Admitting: Cardiovascular Disease

## 2011-04-13 DIAGNOSIS — Q211 Atrial septal defect: Secondary | ICD-10-CM

## 2011-04-13 DIAGNOSIS — R0602 Shortness of breath: Secondary | ICD-10-CM

## 2011-04-13 DIAGNOSIS — I6350 Cerebral infarction due to unspecified occlusion or stenosis of unspecified cerebral artery: Secondary | ICD-10-CM

## 2011-04-13 DIAGNOSIS — I639 Cerebral infarction, unspecified: Secondary | ICD-10-CM

## 2011-04-13 NOTE — Patient Instructions (Signed)
Your physician has requested that you have en exercise stress myoview. For further information please visit [email or web address] Please follow instruction sheet, as given.  Your physician has recommended that you wear an event monitor. Event monitors are medical devices that record the heart's electrical activity. Doctors most often [place] these monitors to diagnose arrhythmias. Arrhythmias are problems with the speed or rhythm of the heartbeat. The monitor is a small, portable device. You can wear one while you do your normal daily activities. This is usually used to diagnose what is causing palpitations/syncope (passing out).  Your physician recommends that you continue on your current medications as directed. Please refer to the Current Medication list given to you today.  Your physician recommends that you schedule a follow-up appointment as needed.

## 2011-04-14 NOTE — Progress Notes (Signed)
HPI:  This is a 55 year old gentleman presenting for evaluation of a patent foramen ovale and a cryptogenic stroke. The patient was admitted to [facility] October 27 when he developed sudden onset of headache then had a fall in the shower. He felt extremely weak. He developed difficulty with handwriting. Symptoms were transient in nature and the patient has fully recovered. His headaches have resolved. The patient has no history of migraine headaches her prior stroke. He was found to have no evidence of large vessel vascular disease. He underwent a transesophageal echocardiogram demonstrating a patent foramen ovale with a positive agitated saline contrast study demonstrating right to left shunt across the PFO. He is considering enrollment in the REDUCE PFO closure trial and presents today for further discussion of this.  The patient has otherwise been healthy. He does have hyperlipidemia and a very strong family history of coronary disease. His father had coronary bypass surgery in his early 42s. He ultimately died of a stroke.  The patient does not have exertional chest pain or pressure. He does have mild dyspnea with exertion. He denies edema, palpitations, lightheadedness, syncope, orthopnea, or PND.  Outpatient Encounter Prescriptions as of 04/13/2011  Medication Sig Dispense Refill  . ALPRAZolam (XANAX) 0.5 MG tablet Take 0.5 mg by mouth daily as needed.        Marland Kitchen aspirin 325 MG tablet Take 325 mg by mouth daily.        Marland Kitchen ibuprofen (ADVIL,MOTRIN) 200 MG tablet Take 200 mg by mouth every 6 (six) hours as needed.        . loratadine (CLARITIN) 10 MG tablet Take 10 mg by mouth daily.        Marland Kitchen PARoxetine (PAXIL-CR) 25 MG 24 hr tablet Take 25 mg by mouth every morning.        . simvastatin (ZOCOR) 10 MG tablet Take 1 tablet (10 mg total) by mouth at bedtime.  90 tablet  3    No Known Allergies  Past Medical History  Diagnosis Date  . Allergic rhinitis   . Depression   . Leukopenia    chronic    ROS: Negative except as per HPI  BP 118/74  Pulse 73  Ht 5\' 9"  (1.753 m)  Wt 75.751 kg (167 lb)  BMI 24.66 kg/m2  PHYSICAL EXAM: Pt is alert and oriented, NAD HEENT: normal Neck: JVP - normal, carotids 2+= without bruits Lungs: CTA bilaterally CV: RRR without murmur or gallop Abd: soft, NT, Positive BS, no hepatomegaly Ext: no C/C/E, distal pulses intact and equal Skin: warm/dry no rash  EKG:  Normal sinus rhythm 73 beats per minute, within normal limits  ASSESSMENT AND PLAN:

## 2011-04-15 ENCOUNTER — Encounter: Payer: Self-pay | Admitting: Cardiovascular Disease

## 2011-04-15 ENCOUNTER — Telehealth: Payer: Self-pay | Admitting: Internal Medicine

## 2011-04-15 DIAGNOSIS — R0602 Shortness of breath: Secondary | ICD-10-CM | POA: Insufficient documentation

## 2011-04-15 NOTE — Telephone Encounter (Signed)
Pt wife would like cindy to return her call concerning her hus Special educational needs teacher)

## 2011-04-15 NOTE — Assessment & Plan Note (Signed)
The patient has shortness of breath with moderate to heavy exertion. He has a very strong family history of coronary artery disease and has had a recent stroke. I have recommended an exercise Myoview stress scan for further risk stratification.

## 2011-04-15 NOTE — Assessment & Plan Note (Signed)
The patient has had a cryptogenic stroke with radiographic findings consistent with an embolic shower. He has a PFO clearly documented a transesophageal echocardiography with transient right-to-left shunt. The patient has a large right-to-left shunt by transcranial Doppler study. We have discussed the clinical equipoise with respect to transcatheter PFO closure and stroke prevention. We had a lengthy discussion regarding current and past clinical trials comparing transcatheter closure to medical therapy. The patient is considering the Reduce trial randomizing patients in 2-1 fashion to transcatheter closure with the Gore Helix device versus medical therapy.  I have encouraged him to consider this trial rather than off label device closure since there has been conflicting data about which treatment is superior.  I have reviewed the risks, indications, and alternatives to transcatheter device closure in case the patient is randomized in the device arm.  Risks include but are not limited to bleeding, infection, stroke, myocardial infarction, cardiac perforation, tamponade, emergency surgery, device embolization, and death.  The patient and his wife understand these risks and agree to proceed if they entered the study and are randomized to device. In the meantime he will continue on antiplatelet therapy with aspirin.

## 2011-04-15 NOTE — Telephone Encounter (Signed)
Wife would like a CBC drawn at the next visit due to his leukocytosis diagnosis she read on chart.

## 2011-04-15 NOTE — Telephone Encounter (Signed)
Lab Results  Component Value Date   WBC 4.2 03/22/2011   HGB 13.1 03/22/2011   HCT 36.9* 03/22/2011   MCV 88.1 03/22/2011   PLT 219 03/22/2011     Cbc normal... No need for follow up

## 2011-04-20 ENCOUNTER — Telehealth: Payer: Self-pay | Admitting: Cardiovascular Disease

## 2011-04-20 ENCOUNTER — Ambulatory Visit: Payer: Managed Care, Other (non HMO) | Admitting: Cardiovascular Disease

## 2011-04-20 NOTE — Telephone Encounter (Signed)
New problem Pt's wife wanted to know if the test she has scheduled has been approved by her insurance. Please Call her back

## 2011-04-20 NOTE — Telephone Encounter (Signed)
I spoke with Ryan Harris and our office did get a prior approval from insurance for pt testing.  I made the pt's wife aware of this information.

## 2011-04-23 ENCOUNTER — Encounter (INDEPENDENT_AMBULATORY_CARE_PROVIDER_SITE_OTHER): Payer: Managed Care, Other (non HMO)

## 2011-04-23 ENCOUNTER — Telehealth: Payer: Self-pay | Admitting: Cardiovascular Disease

## 2011-04-23 ENCOUNTER — Ambulatory Visit (HOSPITAL_COMMUNITY): Payer: Managed Care, Other (non HMO) | Attending: Cardiology | Admitting: Radiology

## 2011-04-23 DIAGNOSIS — Z8679 Personal history of other diseases of the circulatory system: Secondary | ICD-10-CM | POA: Insufficient documentation

## 2011-04-23 DIAGNOSIS — R0609 Other forms of dyspnea: Secondary | ICD-10-CM

## 2011-04-23 DIAGNOSIS — R0602 Shortness of breath: Secondary | ICD-10-CM | POA: Insufficient documentation

## 2011-04-23 DIAGNOSIS — R0989 Other specified symptoms and signs involving the circulatory and respiratory systems: Secondary | ICD-10-CM | POA: Insufficient documentation

## 2011-04-23 DIAGNOSIS — Z8249 Family history of ischemic heart disease and other diseases of the circulatory system: Secondary | ICD-10-CM | POA: Insufficient documentation

## 2011-04-23 DIAGNOSIS — I639 Cerebral infarction, unspecified: Secondary | ICD-10-CM

## 2011-04-23 DIAGNOSIS — R5381 Other malaise: Secondary | ICD-10-CM | POA: Insufficient documentation

## 2011-04-23 DIAGNOSIS — Q211 Atrial septal defect: Secondary | ICD-10-CM

## 2011-04-23 DIAGNOSIS — E785 Hyperlipidemia, unspecified: Secondary | ICD-10-CM | POA: Insufficient documentation

## 2011-04-23 DIAGNOSIS — Q2111 Secundum atrial septal defect: Secondary | ICD-10-CM | POA: Insufficient documentation

## 2011-04-23 DIAGNOSIS — Z87891 Personal history of nicotine dependence: Secondary | ICD-10-CM | POA: Insufficient documentation

## 2011-04-23 MED ORDER — TECHNETIUM TC 99M TETROFOSMIN IV KIT
10.5000 | PACK | Freq: Once | INTRAVENOUS | Status: AC | PRN
  Administered 2011-04-23: 11 via INTRAVENOUS

## 2011-04-23 MED ORDER — TECHNETIUM TC 99M TETROFOSMIN IV KIT
33.0000 | PACK | Freq: Once | INTRAVENOUS | Status: AC | PRN
  Administered 2011-04-23: 33 via INTRAVENOUS

## 2011-04-23 NOTE — Telephone Encounter (Signed)
New Msg: Pt wife call, pt wants to get to the bottom of pre-authorization of test. Pt wife wants to clarify with Lauren if Dr. Excell Seltzer order test--Myocardioal imagining. Please return pt wife call to discuss further.

## 2011-04-23 NOTE — Progress Notes (Signed)
[facility] SITE 3 NUCLEAR MED [address] [place] [address] [phone]  Cardiology Nuclear Med Study  Ryan Harris is a 55 y.o. male [id number] [birth year]   Nuclear Med Background Indication for Stress Test:  Evaluation for Ischemia and  03/21/11 Post Hospital: PFO & cryptogenic stroke History:  10/12 TEE:EF=55-60%, PFO with transient R>L shunt Cardiac Risk Factors: CVA, Family History - CAD, History of Smoking and Lipids  Symptoms:  DOE and Fatigue   Nuclear Pre-Procedure Caffeine/Decaff Intake:  None NPO After: 7:30pm   Lungs:  Clear  IV 0.9% NS with Angio Cath:  20g  IV Site: R Antecubital  IV Started by:  Ryan Harris, EMT-P  Chest Size (in):  42 Cup Size: n/a  Height: 5\' 9"  (1.753 m)  Weight:  164 lb (74.39 kg)  BMI:  Body mass index is 24.22 kg/(m^2). Tech Comments:  NA    Nuclear Med Study 1 or 2 day study: 1 day  Stress Test Type:  Stress  Reading MD: Ryan Ancona, MD  Order Authorizing Provider:  Tonny Bollman, MD  Resting Radionuclide: Technetium 32m Tetrofosmin  Resting Radionuclide Dose: 10.5 mCi   Stress Radionuclide:  Technetium 21m Tetrofosmin  Stress Radionuclide Dose: 33 mCi           Stress Protocol Rest HR: 62 Stress HR: 169  Rest BP: Sitting:116/72  Standing:115/77 Stress BP: 165/77  Exercise Time (min): 11:30 METS: 13.4   Predicted Max HR: 163 bpm % Max HR: 103.68 bpm Rate Pressure Product: 21308   Dose of Adenosine (mg):  n/a Dose of Lexiscan: n/a mg  Dose of Atropine (mg): n/a Dose of Dobutamine: n/a mcg/kg/min (at max HR)  Stress Test Technologist: Ryan Harris, CMA-N  Nuclear Technologist:  Ryan Harris, CNMT     Rest Procedure:  Myocardial perfusion imaging was performed at rest 45 minutes following the intravenous administration of Technetium 60m Tetrofosmin.  Rest ECG: No acute changes.  Stress Procedure:  The patient exercised for 11:30 on the treadmill utilizing the Bruce protocol.  The patient  stopped due to fatigue and denied any chest pain.  There were no diagnostic ST-T wave changes.  Technetium 49m Tetrofosmin was injected at peak exercise and myocardial perfusion imaging was performed after a brief delay.  Stress ECG: No significant change from baseline ECG  QPS Raw Data Images:  Normal; no motion artifact; normal heart/lung ratio. Stress Images:  Mild basal inferior perfusion defect.  Rest Images:  Mild basal inferior perfusion defect.  Subtraction (SDS):  Mild fixed basal inferior perfusion defect.  Transient Ischemic Dilatation (Normal <1.22):  .90 Lung/Heart Ratio (Normal <0.45):  .32  Quantitative Gated Spect Images QGS EDV:  103 ml QGS ESV:  40 ml QGS cine images:  NL LV Function; NL Wall Motion QGS EF: 61%  Impression Exercise Capacity:  Good exercise capacity. BP Response:  Normal blood pressure response. Clinical Symptoms:  Fatigue, no chest pain.  ECG Impression:  No significant ST segment change suggestive of ischemia. Comparison with Prior Nuclear Study: No previous nuclear study performed  Overall Impression:  Small, mild fixed basal inferior perfusion defect. Given wall normal wall motion, I think that this is most likely to represent diaphragmatic attenuation.  No ischemia.   Ryan Harris [employer]

## 2011-04-24 NOTE — Telephone Encounter (Signed)
I spoke with the pt's wife about the authorization letter that she received from the insurance.  I made her aware that the Myocardial Imaging would have been the Myoview that Dr Excell Seltzer ordered.  If she has further questions about Pre-cert she will contact Charmaine.

## 2011-04-29 ENCOUNTER — Telehealth: Payer: Self-pay | Admitting: Cardiovascular Disease

## 2011-04-29 NOTE — Telephone Encounter (Signed)
Results called to pt's wife.

## 2011-04-29 NOTE — Telephone Encounter (Signed)
Pt's wife calling re stress test results from 11-29, also would like copy mailed to her

## 2011-05-28 ENCOUNTER — Other Ambulatory Visit (INDEPENDENT_AMBULATORY_CARE_PROVIDER_SITE_OTHER): Payer: Managed Care, Other (non HMO)

## 2011-05-28 ENCOUNTER — Telehealth: Payer: Self-pay | Admitting: Cardiovascular Disease

## 2011-05-28 DIAGNOSIS — I6350 Cerebral infarction due to unspecified occlusion or stenosis of unspecified cerebral artery: Secondary | ICD-10-CM

## 2011-05-28 DIAGNOSIS — Q211 Atrial septal defect: Secondary | ICD-10-CM

## 2011-05-28 DIAGNOSIS — I639 Cerebral infarction, unspecified: Secondary | ICD-10-CM

## 2011-05-28 LAB — HEPATIC FUNCTION PANEL
ALT: 23 U/L (ref 0–53)
AST: 23 U/L (ref 0–37)
Bilirubin, Direct: 0 mg/dL (ref 0.0–0.3)
Total Bilirubin: 0.6 mg/dL (ref 0.3–1.2)
Total Protein: 6.9 g/dL (ref 6.0–8.3)

## 2011-05-28 LAB — LIPID PANEL
Cholesterol: 158 mg/dL (ref 0–200)
LDL Cholesterol: 92 mg/dL (ref 0–99)
Total CHOL/HDL Ratio: 3
VLDL: 11.4 mg/dL (ref 0.0–40.0)

## 2011-05-28 NOTE — Telephone Encounter (Signed)
I spoke with the pt's wife and gave her the results of event monitor. The pt has been randomized to closure.  I will get this scheduled and call the pt's wife back next week. She would also like the results of monitor mailed to the home (done 05/28/11).

## 2011-05-28 NOTE — Telephone Encounter (Signed)
New problem:  Test result from 21 day event monitor.

## 2011-05-29 MED ORDER — ALPRAZOLAM 0.5 MG PO TABS: 0.5000 mg | ORAL_TABLET | Freq: Every day | ORAL | Status: DC | PRN

## 2011-05-29 MED ORDER — PAROXETINE HCL ER 25 MG PO TB24: 25.0000 mg | ORAL_TABLET | ORAL | Status: DC

## 2011-06-01 ENCOUNTER — Encounter: Payer: Self-pay | Admitting: Cardiovascular Disease

## 2011-06-02 ENCOUNTER — Telehealth: Payer: Self-pay | Admitting: Cardiovascular Disease

## 2011-06-02 ENCOUNTER — Encounter (HOSPITAL_COMMUNITY): Payer: Self-pay

## 2011-06-02 MED ORDER — CLOPIDOGREL BISULFATE 75 MG PO TABS: 75.0000 mg | ORAL_TABLET | Freq: Every day | ORAL | Status: DC

## 2011-06-02 NOTE — Telephone Encounter (Signed)
I spoke with Ryan Harris in research at [facility] and he said that per protocol the pt should be on ASA 81mg  daily starting 3 days prior to closure.  I also verified this information with Dr Excell Seltzer and he said to have the pt follow protocol in regards to ASA.  I spoke with the pt's wife and made her aware of this information. The pt will decrease ASA to 81mg  daily starting 3 days prior to PFO closure.

## 2011-06-02 NOTE — Telephone Encounter (Signed)
Fu call Pt's wife called and said she wants to know if he should stay on 325 aspirin please call

## 2011-06-02 NOTE — Telephone Encounter (Signed)
I spoke with the pt's wife and made her aware of instructions for PFO closure.  Rx for Plavix sent to pharmacy.  Instructions mailed to the pt's home.

## 2011-06-04 ENCOUNTER — Other Ambulatory Visit: Payer: Self-pay | Admitting: Cardiovascular Disease

## 2011-06-05 ENCOUNTER — Telehealth: Payer: Self-pay | Admitting: Cardiovascular Disease

## 2011-06-05 NOTE — Telephone Encounter (Signed)
I spoke with the pt's wife and made her aware that Dr Excell Seltzer has spoken with the insurance company today and has given them his cell phone number to call once a decision is made.

## 2011-06-05 NOTE — Telephone Encounter (Signed)
Pt's wife calling re, 06-09-11 surgery, has this been approved with insurance?

## 2011-06-08 NOTE — Telephone Encounter (Signed)
The pt's procedure was denied by insurance.  Dr Excell Seltzer spoke with the pt's wife over the weekend.

## 2011-06-09 ENCOUNTER — Ambulatory Visit (HOSPITAL_COMMUNITY)
Admission: RE | Admit: 2011-06-09 | Payer: Managed Care, Other (non HMO) | Source: Ambulatory Visit | Admitting: Cardiovascular Disease

## 2011-06-09 ENCOUNTER — Encounter (HOSPITAL_COMMUNITY): Admission: RE | Payer: Self-pay | Source: Ambulatory Visit

## 2011-06-09 SURGERY — PATENT FORAMEN OVALE CLOSURE
Anesthesia: LOCAL

## 2011-06-10 ENCOUNTER — Telehealth: Payer: Self-pay | Admitting: Cardiovascular Disease

## 2011-06-10 NOTE — Telephone Encounter (Signed)
New Msg: Call from indvidual who handles insurance. Needing nurse to call insurance company and give a peer to peer conference. Wes Harbinson said specifically MD nurse needs to be the individual to call company and participate in conference.   Insurance company # for peer to peer conference: [phone]  (Ask for precert department- and request peer to peer conference)   Please return call to [facility] with any questions.

## 2011-06-10 NOTE — Telephone Encounter (Signed)
I spoke with Chip Boer and she called the insurance company again and they told her that two devices have been approved the amplatzer and cardioseal for closure.  I made her aware that there are no FDA approved devices for PFO closure (per Dr Excell Seltzer).  These devices are approved for ASD closure.

## 2011-06-10 NOTE — Telephone Encounter (Signed)
F/U   Kerry Fort returning nurse LB call for patient. Can be reached at [phone]

## 2011-06-10 NOTE — Telephone Encounter (Signed)
FU call: Vicky returning call to speak to Lauren again. Please return call to discuss further.

## 2011-06-10 NOTE — Telephone Encounter (Signed)
I spoke with Wallie Renshaw and she works at [employer] with the patient and helps with his insurance.  She said that Mr Hickling requested that she contact his insurance company in regards to coverage for PFO closure.  She called the insurance and they told her that the next step is a peer to peer review.  I made her aware that Dr Excell Seltzer did a peer to peer review on Friday. They also told her that if the MD changed his codes then this would be a covered procedure.  I made her aware that the pt's code cannot be changed because he has a PFO not an ASD. She will make pt aware of this information. Chip Boer said that she had one other possible contact that she would call about this procedure and see if they would help fund the procedure.

## 2011-06-10 NOTE — Telephone Encounter (Signed)
Left message to call back  

## 2011-07-13 ENCOUNTER — Telehealth: Payer: Self-pay | Admitting: Cardiovascular Disease

## 2011-07-13 NOTE — Telephone Encounter (Signed)
I spoke with Misty Stanley and made her aware that Dr Excell Seltzer would like the pt to remain on ASA 81mg  daily and Plavix 75mg  daily.  Per Dr Excell Seltzer it should not be much longer before the pt can be scheduled for closure.

## 2011-07-13 NOTE — Telephone Encounter (Signed)
New msg: Pt wife calling wanting to speak with nurse regarding pt being put on plavix prior to surgery,Jun 09, 2011. However surgery was cancelled. Dr. Excell Seltzer suggested pt stay on plavix thinking that surgery will be rescheduled soon. It has been over one month and surgery has not been rs. Pt wife wants to know if pt needs to continue taking plavix.  Pt was also switched down to baby aspirin 81 mg from aspirin 325 mg and pt wife wants to know if MD wants pt to continue taking aspirin 81 mg or go back to taking aspirin 325 mg.  Please return pt wife call to discuss further.

## 2011-07-24 ENCOUNTER — Ambulatory Visit (INDEPENDENT_AMBULATORY_CARE_PROVIDER_SITE_OTHER): Payer: Managed Care, Other (non HMO) | Admitting: Cardiovascular Disease

## 2011-07-24 ENCOUNTER — Encounter: Payer: Self-pay | Admitting: Cardiovascular Disease

## 2011-07-24 VITALS — BP 128/82 | HR 68 | Ht 69.0 in | Wt 163.8 lb

## 2011-07-24 DIAGNOSIS — Q211 Atrial septal defect: Secondary | ICD-10-CM

## 2011-07-24 NOTE — Progress Notes (Signed)
   HPI:  This is a 56-year-old gentleman with a patent foramen ovale and a history of cryptogenic stroke. He has been enrolled in the Gore Helix REDUCE Trial, and has been randomized to device closure of his PFO. The patient presents today for followup evaluation. Since his last visit here, he denies any episodes of headache, amaurosis fugax, numbness/tingling/weakness of his extremities. He overall feels well and has no complaints today. He specifically denies chest pain or dyspnea.  Outpatient Encounter Prescriptions as of 07/24/2011  Medication Sig Dispense Refill  . ALPRAZolam (XANAX) 0.5 MG tablet Take 1 tablet (0.5 mg total) by mouth daily as needed.  30 tablet  3  . aspirin 81 MG tablet Take 1 tablet (81 mg total) by mouth daily.      . clopidogrel (PLAVIX) 75 MG tablet Take 1 tablet (75 mg total) by mouth daily.  30 tablet  6  . ibuprofen (ADVIL,MOTRIN) 200 MG tablet Take 600 mg by mouth daily as needed. For pain.      . loratadine (CLARITIN) 10 MG tablet Take 10 mg by mouth daily.        . PARoxetine (PAXIL-CR) 25 MG 24 hr tablet Take 1 tablet (25 mg total) by mouth every morning.  90 tablet  3  . simvastatin (ZOCOR) 10 MG tablet Take 1 tablet (10 mg total) by mouth at bedtime.  90 tablet  3    No Known Allergies  Past Medical History  Diagnosis Date  . Allergic rhinitis   . Depression   . Leukopenia     chronic  . Cryptogenic stroke   . Patent foramen ovale     ROS: Negative except as per HPI  BP 128/82  Pulse 68  Ht 5' 9" (1.753 m)  Wt 74.299 kg (163 lb 12.8 oz)  BMI 24.19 kg/m2  PHYSICAL EXAM: Pt is alert and oriented, NAD HEENT: normal Neck: JVP - normal, carotids 2+= without bruits Lungs: CTA bilaterally CV: RRR without murmur or gallop Abd: soft, NT, Positive BS, no hepatomegaly Ext: no C/C/E, distal pulses intact and equal Skin: warm/dry no rash  ASSESSMENT AND PLAN:   

## 2011-07-24 NOTE — Patient Instructions (Signed)
Dr Excell Seltzer has scheduled you for PFO closure.   Your physician recommends that you continue on your current medications as directed. Please refer to the Current Medication list given to you today.

## 2011-07-24 NOTE — Assessment & Plan Note (Signed)
The patient has a PFO and history of cryptogenic stroke with no other risk factors for stroke. He has a large shunt based on transcranial Doppler findings and he has evidence of right-to-left shunting on his TEE as well. He is scheduled for transcatheter PFO closure. I again reviewed the risks, indication, and alternatives of this procedure with the patient. The procedure will be performed with intracardiac echo. Risks include but are not limited to bleeding, infection, device embolization, stroke, myocardial infarction, cardiac perforation tamponade, emergency open heart surgery, and death. The risk of major adverse events are low at 1% or less.

## 2011-07-27 ENCOUNTER — Encounter (HOSPITAL_COMMUNITY): Payer: Self-pay | Admitting: Pharmacy Technician

## 2011-07-29 ENCOUNTER — Encounter (HOSPITAL_COMMUNITY): Payer: Self-pay | Admitting: General Practice

## 2011-07-29 ENCOUNTER — Other Ambulatory Visit: Payer: Self-pay

## 2011-07-29 ENCOUNTER — Ambulatory Visit (HOSPITAL_COMMUNITY)
Admission: RE | Admit: 2011-07-29 | Discharge: 2011-07-30 | Disposition: A | Discharge status: Home / Self Care | Payer: Managed Care, Other (non HMO) | Source: Ambulatory Visit | Attending: Cardiovascular Disease | Admitting: Cardiovascular Disease

## 2011-07-29 ENCOUNTER — Encounter (HOSPITAL_COMMUNITY)
Admission: RE | Disposition: A | Discharge status: Home / Self Care | Payer: Self-pay | Source: Ambulatory Visit | Attending: Cardiovascular Disease

## 2011-07-29 DIAGNOSIS — Q2112 Patent foramen ovale: Secondary | ICD-10-CM | POA: Insufficient documentation

## 2011-07-29 DIAGNOSIS — D72819 Decreased white blood cell count, unspecified: Secondary | ICD-10-CM | POA: Insufficient documentation

## 2011-07-29 DIAGNOSIS — Q2111 Secundum atrial septal defect: Secondary | ICD-10-CM | POA: Insufficient documentation

## 2011-07-29 DIAGNOSIS — Z8673 Personal history of transient ischemic attack (TIA), and cerebral infarction without residual deficits: Secondary | ICD-10-CM | POA: Insufficient documentation

## 2011-07-29 DIAGNOSIS — Q211 Atrial septal defect: Secondary | ICD-10-CM | POA: Insufficient documentation

## 2011-07-29 HISTORY — PX: PATENT FORAMEN OVALE CLOSURE: SHX2181

## 2011-07-29 HISTORY — PX: PATENT FORAMEN OVALE CLOSURE: SHX5483

## 2011-07-29 LAB — POCT I-STAT, CHEM 8
BUN: 15 mg/dL (ref 6–23)
Creatinine, Ser: 1.1 mg/dL (ref 0.50–1.35)
Potassium: 4.1 mEq/L (ref 3.5–5.1)
Sodium: 145 mEq/L (ref 135–145)
TCO2: 25 mmol/L (ref 0–100)

## 2011-07-29 LAB — CBC
HCT: 41.3 % (ref 39.0–52.0)
MCH: 31.3 pg (ref 26.0–34.0)
MCV: 88.6 fL (ref 78.0–100.0)
Platelets: 249 10*3/uL (ref 150–400)
RBC: 4.66 MIL/uL (ref 4.22–5.81)
WBC: 4.4 10*3/uL (ref 4.0–10.5)

## 2011-07-29 LAB — POCT ACTIVATED CLOTTING TIME
Activated Clotting Time: 215 seconds
Activated Clotting Time: 210 seconds

## 2011-07-29 SURGERY — PATENT FORAMEN OVALE CLOSURE
Anesthesia: LOCAL

## 2011-07-29 MED ORDER — ASPIRIN EC 81 MG PO TBEC
81.0000 mg | DELAYED_RELEASE_TABLET | Freq: Every day | ORAL | Status: DC
  Administered 2011-07-30: 81 mg via ORAL
  Filled 2011-07-29 (×2): qty 1

## 2011-07-29 MED ORDER — SODIUM CHLORIDE 0.9 % IJ SOLN: 3.0000 mL | INTRAMUSCULAR | Status: DC | PRN

## 2011-07-29 MED ORDER — SODIUM CHLORIDE 0.9 % IJ SOLN
3.0000 mL | Freq: Two times a day (BID) | INTRAMUSCULAR | Status: DC
  Administered 2011-07-29 – 2011-07-30 (×2): 3 mL via INTRAVENOUS

## 2011-07-29 MED ORDER — SODIUM CHLORIDE 0.9 % IV SOLN
INTRAVENOUS | Status: DC
  Administered 2011-07-29: 1000 mL via INTRAVENOUS

## 2011-07-29 MED ORDER — SODIUM CHLORIDE 0.9 % IJ SOLN: 3.0000 mL | Freq: Two times a day (BID) | INTRAMUSCULAR | Status: DC

## 2011-07-29 MED ORDER — SIMVASTATIN 10 MG PO TABS
10.0000 mg | ORAL_TABLET | Freq: Every day | ORAL | Status: DC
  Administered 2011-07-29: 10 mg via ORAL
  Filled 2011-07-29 (×2): qty 1

## 2011-07-29 MED ORDER — ONDANSETRON HCL 4 MG/2ML IJ SOLN: 4.0000 mg | Freq: Four times a day (QID) | INTRAMUSCULAR | Status: DC | PRN

## 2011-07-29 MED ORDER — ASPIRIN 81 MG PO CHEW: 81.0000 mg | CHEWABLE_TABLET | ORAL | Status: DC

## 2011-07-29 MED ORDER — ACETAMINOPHEN 325 MG PO TABS: 650.0000 mg | ORAL_TABLET | ORAL | Status: DC | PRN

## 2011-07-29 MED ORDER — PAROXETINE HCL ER 25 MG PO TB24
25.0000 mg | ORAL_TABLET | Freq: Every day | ORAL | Status: DC
  Administered 2011-07-30: 25 mg via ORAL
  Filled 2011-07-29: qty 1

## 2011-07-29 MED ORDER — MIDAZOLAM HCL 2 MG/2ML IJ SOLN
INTRAMUSCULAR | Status: AC
  Filled 2011-07-29: qty 2

## 2011-07-29 MED ORDER — ASPIRIN 81 MG PO TBEC: 81.0000 mg | DELAYED_RELEASE_TABLET | Freq: Every day | ORAL | Status: DC

## 2011-07-29 MED ORDER — ALPRAZOLAM 0.5 MG PO TABS: 0.5000 mg | ORAL_TABLET | Freq: Every day | ORAL | Status: DC | PRN

## 2011-07-29 MED ORDER — HEPARIN (PORCINE) IN NACL 2-0.9 UNIT/ML-% IJ SOLN
INTRAMUSCULAR | Status: AC
  Filled 2011-07-29: qty 2000

## 2011-07-29 MED ORDER — SODIUM CHLORIDE 0.9 % IV SOLN: 1.0000 mL/kg/h | INTRAVENOUS | Status: AC

## 2011-07-29 MED ORDER — OXYCODONE-ACETAMINOPHEN 5-325 MG PO TABS
1.0000 | ORAL_TABLET | ORAL | Status: DC | PRN
  Administered 2011-07-29: 2 via ORAL
  Filled 2011-07-29: qty 2

## 2011-07-29 MED ORDER — HEPARIN SODIUM (PORCINE) 1000 UNIT/ML IJ SOLN
INTRAMUSCULAR | Status: AC
  Filled 2011-07-29: qty 1

## 2011-07-29 MED ORDER — DIAZEPAM 5 MG PO TABS
5.0000 mg | ORAL_TABLET | ORAL | Status: AC
  Administered 2011-07-29: 5 mg via ORAL
  Filled 2011-07-29: qty 1

## 2011-07-29 MED ORDER — CEFAZOLIN SODIUM 1-5 GM-% IV SOLN: 1.0000 g | Freq: Once | INTRAVENOUS | Status: DC

## 2011-07-29 MED ORDER — CLOPIDOGREL BISULFATE 75 MG PO TABS
75.0000 mg | ORAL_TABLET | ORAL | Status: DC
  Filled 2011-07-29: qty 1

## 2011-07-29 MED ORDER — LIDOCAINE HCL (PF) 1 % IJ SOLN
INTRAMUSCULAR | Status: AC
  Filled 2011-07-29: qty 30

## 2011-07-29 MED ORDER — LORATADINE 10 MG PO TABS
10.0000 mg | ORAL_TABLET | Freq: Every day | ORAL | Status: DC
  Administered 2011-07-30: 10 mg via ORAL
  Filled 2011-07-29: qty 1

## 2011-07-29 MED ORDER — SODIUM CHLORIDE 0.9 % IV SOLN: 250.0000 mL | INTRAVENOUS | Status: DC | PRN

## 2011-07-29 MED ORDER — FENTANYL CITRATE 0.05 MG/ML IJ SOLN
INTRAMUSCULAR | Status: AC
  Filled 2011-07-29: qty 2

## 2011-07-29 MED ORDER — SODIUM CHLORIDE 0.9 % IV SOLN: 250.0000 mL | INTRAVENOUS | Status: DC

## 2011-07-29 MED ORDER — CLOPIDOGREL BISULFATE 75 MG PO TABS
75.0000 mg | ORAL_TABLET | Freq: Every day | ORAL | Status: DC
  Administered 2011-07-30: 75 mg via ORAL

## 2011-07-29 NOTE — Op Note (Signed)
   Cardiac Catheterization Procedure Note  Name: Ryan Harris MRN: [id number] DOB: [birth year]  Procedure: Intracardiac echo, transcatheter PFO closure  Indication: Ryan Harris is a 56 year old gentleman with a history of cryptogenic stroke. He has fully recovered. He was noted to have a patent foramen ovale by TEE. He had a large shunt by intracranial Doppler. He was randomized to the device arm of the Gore Reduce Trial.    Procedural details: The right groin was prepped, draped, and anesthetized with 1% lidocaine. Using modified Seldinger technique, a 6 French sheath was placed in the left femoral vein and an 11 French sheath was placed in the left femoral vein just below the 6 [place]. Intracardiac echo was performed through the 11 French sheath. Imaging of the tricuspid, mitral, and aortic valve was done. Careful imaging of the interatrial septum was performed. A patent foramen ovale was confirmed. Measurements of the PFO diameter and tunnel length were made. 5000 units of unfractionated heparin was administered. An angled glide wire was used to cross the PFO this was changed out to an Amplatz wire. A 24 mm sizing balloon was placed in standard protocol was followed to size the defect. The defect diameter appeared to be approximately 6-8 mm and the tunnel length was fairly short period a 20 mm Gore septal occluder device was chosen. Using normal protocol, the defect was closed successfully after positioning the device in the left atrium going back to the interatrial septum and then deployed in the right atrial portion of the device. The procedure was guided both by fluoroscopy and intracardiac echo. After the device was released, a bubble study was performed that showed no residual right-to-left shunt. The patient tolerated the procedure very well. There were no procedural complications.  Procedural Findings: There is a moderate-sized PFO present as described above. There was no significant valvular  disease or other structural disease noted by intracardiac echo.  Final Conclusions:  Successful transcatheter closure of a patent foramen ovale utilizing a 20 mm Gore septal occluder device   Recommendations: Overnight observation, 2-D echocardiogram and chest x-ray in the morning. The patient will receive antiplatelet therapy per the study protocol  Ryan Harris 07/29/2011, 6:58 PM

## 2011-07-29 NOTE — H&P (View-Only) (Signed)
   HPI:  This is a 56 year old gentleman with a patent foramen ovale and a history of cryptogenic stroke. He has been enrolled in the [facility] Helix REDUCE Trial, and has been randomized to device closure of his PFO. The patient presents today for followup evaluation. Since his last visit here, he denies any episodes of headache, amaurosis fugax, numbness/tingling/weakness of his extremities. He overall feels well and has no complaints today. He specifically denies chest pain or dyspnea.  Outpatient Encounter Prescriptions as of 07/24/2011  Medication Sig Dispense Refill  . ALPRAZolam (XANAX) 0.5 MG tablet Take 1 tablet (0.5 mg total) by mouth daily as needed.  30 tablet  3  . aspirin 81 MG tablet Take 1 tablet (81 mg total) by mouth daily.      . clopidogrel (PLAVIX) 75 MG tablet Take 1 tablet (75 mg total) by mouth daily.  30 tablet  6  . ibuprofen (ADVIL,MOTRIN) 200 MG tablet Take 600 mg by mouth daily as needed. For pain.      Marland Kitchen loratadine (CLARITIN) 10 MG tablet Take 10 mg by mouth daily.        Marland Kitchen PARoxetine (PAXIL-CR) 25 MG 24 hr tablet Take 1 tablet (25 mg total) by mouth every morning.  90 tablet  3  . simvastatin (ZOCOR) 10 MG tablet Take 1 tablet (10 mg total) by mouth at bedtime.  90 tablet  3    No Known Allergies  Past Medical History  Diagnosis Date  . Allergic rhinitis   . Depression   . Leukopenia     chronic  . Cryptogenic stroke   . Patent foramen ovale     ROS: Negative except as per HPI  BP 128/82  Pulse 68  Ht 5\' 9"  (1.753 m)  Wt 74.299 kg (163 lb 12.8 oz)  BMI 24.19 kg/m2  PHYSICAL EXAM: Pt is alert and oriented, NAD HEENT: normal Neck: JVP - normal, carotids 2+= without bruits Lungs: CTA bilaterally CV: RRR without murmur or gallop Abd: soft, NT, Positive BS, no hepatomegaly Ext: no C/C/E, distal pulses intact and equal Skin: warm/dry no rash  ASSESSMENT AND PLAN:

## 2011-07-29 NOTE — Interval H&P Note (Signed)
History and Physical Interval Note:  07/29/2011 8:27 AM  Ryan Harris  has presented today for surgery, with the diagnosis of pfo  The various methods of treatment have been discussed with the patient and family. After consideration of risks, benefits and other options for treatment, the patient has consented to  Procedure(s) (LRB): PATENT FORAMEN OVALE CLOSURE (N/A) as a surgical intervention .  [name] history has been reviewed, patient examined, no change in status, stable for surgery.  I have reviewed the patients' chart and labs.  Questions were answered to the patient's satisfaction.  Pt evaluated this am. No changes to add.   Tonny Bollman 07/29/2011 8:27 AM

## 2011-07-30 ENCOUNTER — Encounter (HOSPITAL_COMMUNITY): Payer: Self-pay | Admitting: Physician Assistant

## 2011-07-30 ENCOUNTER — Ambulatory Visit (HOSPITAL_COMMUNITY): Payer: Managed Care, Other (non HMO)

## 2011-07-30 DIAGNOSIS — Q211 Atrial septal defect: Secondary | ICD-10-CM

## 2011-07-30 MED ORDER — CLOPIDOGREL BISULFATE 75 MG PO TABS: 75.0000 mg | ORAL_TABLET | Freq: Every day | ORAL | Status: DC

## 2011-07-30 NOTE — Progress Notes (Signed)
  Echocardiogram 2D Echocardiogram has been performed.  Ryan Harris 07/30/2011, 3:05 PM

## 2011-07-30 NOTE — Discharge Summary (Signed)
Discharge Summary   Patient ID: Ryan Harris MRN: [id number], DOB/AGE: 1956/05/09 56 y.o. Admit date: 07/29/2011 D/C date:     07/30/2011   Primary Discharge Diagnoses:  1. PFO with R-to-L shunting by TEE 2012 - s/p successful transcatheter closure of PFO with 20mm Gore septal occluder device 07/29/11 2. H/o cryptogenic stroke  Secondary Discharge Diagnoses:  1. Hyperlipidemia 2. Leukopenia 3. Depression 4. Allergic rhinitis 5. Hx of nuclear stress test 03/2011 demonstrating small, mild fixed basal inferior perfusion defect felt most likely attenuation without ischemia  Hospital Course: 56 y/o M with hx of cryptogenic stroke and PFO. He has been enrolled in the [facility] Helix REDUCE Trial, and has been randomized to device closure of his PFO. In October at time of his stroke, he underwent a transesophageal echocardiogram demonstrating a patent foramen ovale with a positive agitated saline contrast study demonstrating right to left shunt across the PFO. He also reported a strong family hx of CAD so myoview was pursued back in 03/2011 demonstrating small, mild fixed basal inferior perfusion defect. Given wall normal wall motion, Dr. Shirlee Latch read the study to most likely to represent diaphragmatic attenuation without ischemia, with EF 61%. The patient returned for f/u this month and had elected to enroll in the [facility] Helix REDUCE Trial, and has been randomized to device closure of his PFO. Dr. Excell Seltzer discussed risks, benefits and alternatives with the patient who elected to proceed. He ultimately had successful transcatheter closure of a patent foramen ovale utilizing a 20 mm Gore septal occluder device. He was observed overnight. Device position looked good per Dr. Excell Seltzer by morning X-ray. Dr. Pearlean Brownie also evaluated the patient, and felt he was doing well with normal neurological exam and NIHSS zero. 2D echo today demonstrated no complication per preliminary report from Dr. Shirlee Latch. Both Dr. Excell Seltzer and Dr. Pearlean Brownie  have seen and evaluated the patient and feel he is stable for discharge. Per study protocol, he will complete 3 days of plavix on 08/01/11 (including today's dose) in addition to aspirin, then from then on will only maintained only on 81mg  of aspirin. Per Dr. Excell Seltzer he may return to work Wednesday 08/05/11. Follow-up will be arranged by the study protocol.   Discharge Vitals: Blood pressure 108/70, pulse 72, temperature 99.5 F (37.5 C), temperature source Oral, resp. rate 18, height 5\' 9"  (1.753 m), weight 160 lb (72.576 kg), SpO2 99.00%.  Labs: Lab Results  Component Value Date   WBC 4.4 07/29/2011   HGB 12.2* 07/29/2011   HCT 36.0* 07/29/2011   MCV 88.6 07/29/2011   PLT 249 07/29/2011     Lab 07/29/11 0857  NA 145  K 4.1  CL 106  CO2 --  BUN 15  CREATININE 1.10  CALCIUM --  PROT --  BILITOT --  ALKPHOS --  ALT --  AST --  GLUCOSE 99    Lab Results  Component Value Date   CHOL 158 05/28/2011   HDL 54.30 05/28/2011   LDLCALC 92 05/28/2011   TRIG 57.0 05/28/2011     Diagnostic Studies/Procedures   1. Chest 2 View 07/30/2011  *RADIOLOGY REPORT*  Clinical Data: Post catheterization and repair of patent foramen ovale, soreness  CHEST - 2 VIEW  Comparison: 03/21/2011  Findings: Newly identified device projects over the expected position of the atria, suspect atrial septal closure device. Normal heart size, mediastinal contours, pulmonary vascularity. Minimal chronic peribronchial thickening. No pulmonary infiltrate, pleural effusion or pneumothorax. Bones unremarkable.  IMPRESSION: No acute abnormalities.  Original Report  Authenticated By: Lollie Marrow, M.D.    Discharge Medications   Medication List  As of 07/30/2011  2:52 PM   TAKE these medications         ALPRAZolam 0.5 MG tablet   Commonly known as: XANAX   Take 0.5 mg by mouth daily as needed.      aspirin 81 MG tablet   Take 1 tablet (81 mg total) by mouth daily.      clopidogrel 75 MG tablet   Commonly known as: PLAVIX   Take 1  tablet (75 mg total) by mouth daily. You will only have to take Plavix for 2 more days (07/31/11 and 08/01/11). After those two doses, you will no longer be taking this medicine.      loratadine 10 MG tablet   Commonly known as: CLARITIN   Take 10 mg by mouth daily.      PARoxetine 25 MG 24 hr tablet   Commonly known as: PAXIL-CR   Take 25 mg by mouth every morning.      simvastatin 10 MG tablet   Commonly known as: ZOCOR   Take 10 mg by mouth at bedtime.            Disposition   The patient will be discharged in stable condition to home. Discharge Orders    Future Orders Please Complete By Expires   Diet - low sodium heart healthy      Increase activity slowly      Comments:   No driving for 1 day. No heavy lifting for 4 weeks. No sexual activity for 1 week. You may return to work on 08/05/11.   Discharge instructions      Comments:   Remember, you only have to take Plavix for 2 more days then you will be stopping this medicine completely. Your last dose will be on 08/01/11.     Follow-up Information    Follow up with Gore Helix REDUCE Trial. (The trial coordinators will help arrange your follow-up. If you have not heard from them within 1 week, please call your neurologist's office. In the meantime if you have any questions you can always call Dr. Earmon Phoenix office [phone]).)            Duration of Discharge Encounter: Greater than 30 minutes including physician and PA time.  Signed, Ronie Spies PA-C 07/30/2011, 2:52 PM

## 2011-07-30 NOTE — Progress Notes (Signed)
Patient ID: Ryan Harris, male   DOB: [birth year], 56 y.o.   MRN: [id number]  I saw Ryan Harris briefly as part of GORE REDUCE PFO closure trial post procedure visit. He is doing well with normal neurological exam and NIHSS zero. He will be discharged home today and follow up with me as per study protocol.Continue aspirin indefinitely and plavix for 3 days only as per study protocol. D/w patient, wife and answered questions.

## 2011-07-30 NOTE — Progress Notes (Signed)
    Subjective:  Feels good. No chest pain or dyspnea. Has been up and around this am without problems.  Objective:  Vital Signs in the last 24 hours: Temp:  [97.7 F (36.5 C)-98.7 F (37.1 C)] 98.2 F (36.8 C) (03/07 0750) Pulse Rate:  [53-65] 63  (03/07 0750) Resp:  [17-20] 18  (03/07 0750) BP: (96-119)/(55-71) 119/68 mmHg (03/07 0750) SpO2:  [96 %-100 %] 98 % (03/07 0750)  Intake/Output from previous day: 03/06 0701 - 03/07 0700 In: 1387.9 [P.O.:720; I.V.:667.9] Out: 900 [Urine:900]  Physical Exam: Pt is alert and oriented, NAD HEENT: normal Neck: JVP - normal, carotids 2+= without bruits Lungs: CTA bilaterally CV: RRR without murmur or gallop Abd: soft, NT, Positive BS, no hepatomegaly Ext: no C/C/E, distal pulses intact and equal. Right groin with mild ecchymosis. Skin: warm/dry no rash   Lab Results:  Basename 07/29/11 0857 07/29/11 0654  WBC -- 4.4  HGB 12.2* 14.6  PLT -- 249    Basename 07/29/11 0857  NA 145  K 4.1  CL 106  CO2 --  GLUCOSE 99  BUN 15  CREATININE 1.10   No results found for this basename: TROPONINI:2,CK,MB:2 in the last 72 hours  Tele: Sinus rhythm  Assessment/Plan:  PFO s/p device closure. Device position looks good by XRAY. Plan d/c home this am on ASA and plavix. Plavix x 3 days per study protocol, then ASA 81 mg indefinite. Follow-up per [facility] Reduce Study protocol. Needs neuro exam by Dr Pearlean Brownie prior to discharge.  Tonny Bollman, M.D. 07/30/2011, 8:47 AM

## 2011-07-31 NOTE — Discharge Summary (Signed)
Agree as outlined. See my progress note this same date. Ryan Harris 07/31/2011 5:12 AM

## 2011-09-03 ENCOUNTER — Other Ambulatory Visit: Payer: Self-pay

## 2011-09-03 DIAGNOSIS — Q211 Atrial septal defect: Secondary | ICD-10-CM

## 2011-09-18 ENCOUNTER — Other Ambulatory Visit (HOSPITAL_COMMUNITY): Payer: Managed Care, Other (non HMO)

## 2011-10-14 ENCOUNTER — Telehealth: Payer: Self-pay | Admitting: Cardiovascular Disease

## 2011-10-14 MED ORDER — AMOXICILLIN 500 MG PO TABS: ORAL_TABLET | ORAL | Status: DC

## 2011-10-14 NOTE — Telephone Encounter (Signed)
New msg Pt's wife called and wanted to know should he have antibiotic before going to dentist.

## 2011-10-14 NOTE — Telephone Encounter (Signed)
I spoke with the pt's wife and made her aware that the pt does need SBE for 6 months after PFO closure.  [facility] will [facility] to [facility] per wife's request.  Pt has NKDA.

## 2011-11-16 ENCOUNTER — Telehealth: Payer: Self-pay | Admitting: Cardiovascular Disease

## 2011-11-16 NOTE — Telephone Encounter (Signed)
New msg Pt's wife wants to make sure echo he is having done on is not billed to his insurance since it is thru research

## 2011-11-17 ENCOUNTER — Telehealth: Payer: Self-pay | Admitting: Cardiovascular Disease

## 2011-11-17 NOTE — Telephone Encounter (Signed)
I spoke with the pt's and she is aware of ECHO appointment.

## 2011-11-17 NOTE — Telephone Encounter (Signed)
Pt's wife rtn you call, pls call [phone], pt in research study

## 2011-11-18 ENCOUNTER — Other Ambulatory Visit (HOSPITAL_COMMUNITY): Payer: Self-pay | Admitting: Cardiovascular Disease

## 2011-11-19 ENCOUNTER — Encounter (HOSPITAL_COMMUNITY): Payer: Self-pay

## 2011-11-19 ENCOUNTER — Ambulatory Visit (HOSPITAL_COMMUNITY): Payer: Managed Care, Other (non HMO) | Attending: Cardiology

## 2011-11-19 ENCOUNTER — Other Ambulatory Visit: Payer: Self-pay

## 2011-11-19 DIAGNOSIS — Q2111 Secundum atrial septal defect: Secondary | ICD-10-CM | POA: Insufficient documentation

## 2011-11-19 DIAGNOSIS — R0609 Other forms of dyspnea: Secondary | ICD-10-CM | POA: Insufficient documentation

## 2011-11-19 DIAGNOSIS — R0989 Other specified symptoms and signs involving the circulatory and respiratory systems: Secondary | ICD-10-CM | POA: Insufficient documentation

## 2011-11-19 DIAGNOSIS — Q211 Atrial septal defect: Secondary | ICD-10-CM | POA: Insufficient documentation

## 2011-11-19 DIAGNOSIS — Z8673 Personal history of transient ischemic attack (TIA), and cerebral infarction without residual deficits: Secondary | ICD-10-CM | POA: Insufficient documentation

## 2011-11-19 NOTE — Progress Notes (Signed)
Patient ID: Ryan Harris, male   DOB: April 01, 1956, 56 y.o.   MRN: [id number] IV with 20 G angiocath (R) AC x 1,  tolerated well for Echo Bubble Study.IV site unremarkable.Irean Hong, RN.

## 2011-12-30 ENCOUNTER — Telehealth: Payer: Self-pay | Admitting: Internal Medicine

## 2011-12-30 MED ORDER — SIMVASTATIN 10 MG PO TABS: 10.0000 mg | ORAL_TABLET | Freq: Every day | ORAL | Status: DC

## 2011-12-30 NOTE — Telephone Encounter (Signed)
rx sent in electronically 

## 2011-12-30 NOTE — Telephone Encounter (Signed)
Pt is changing pharmacy's and is requesting a refill on simvastatin (ZOCOR) 10 MG tablet   [facility] phone :[phone]

## 2011-12-31 ENCOUNTER — Other Ambulatory Visit: Payer: Self-pay | Admitting: Internal Medicine

## 2012-01-01 ENCOUNTER — Other Ambulatory Visit: Payer: Self-pay | Admitting: *Deleted

## 2012-01-01 MED ORDER — SIMVASTATIN 10 MG PO TABS: 10.0000 mg | ORAL_TABLET | Freq: Every day | ORAL | Status: DC

## 2012-03-01 ENCOUNTER — Other Ambulatory Visit: Payer: Self-pay | Admitting: *Deleted

## 2012-03-01 MED ORDER — ALPRAZOLAM 0.5 MG PO TABS: 0.5000 mg | ORAL_TABLET | Freq: Every day | ORAL | Status: DC | PRN

## 2012-03-08 ENCOUNTER — Telehealth: Payer: Self-pay | Admitting: Cardiovascular Disease

## 2012-03-08 NOTE — Telephone Encounter (Signed)
He can join a gym and exercise is unrestricted.

## 2012-03-08 NOTE — Telephone Encounter (Signed)
I will forward this message to Dr Excell Seltzer to see if the pt has any restrictions at this time.

## 2012-03-08 NOTE — Telephone Encounter (Signed)
Pt's wife calling re if ok for pt to join a gym, any restrictions on work out routine?

## 2012-03-09 NOTE — Telephone Encounter (Signed)
I spoke with the pt's wife and made her aware of Dr Earmon Phoenix comments.

## 2012-03-24 ENCOUNTER — Other Ambulatory Visit: Payer: Self-pay | Admitting: Internal Medicine

## 2012-04-27 ENCOUNTER — Other Ambulatory Visit (INDEPENDENT_AMBULATORY_CARE_PROVIDER_SITE_OTHER): Payer: Managed Care, Other (non HMO)

## 2012-04-27 DIAGNOSIS — Z0000 Encounter for general adult medical examination without abnormal findings: Secondary | ICD-10-CM

## 2012-04-27 LAB — POCT URINALYSIS DIPSTICK
Bilirubin, UA: NEGATIVE
Blood, UA: NEGATIVE
Glucose, UA: NEGATIVE
Leukocytes, UA: NEGATIVE
Nitrite, UA: NEGATIVE
Urobilinogen, UA: 0.2

## 2012-04-27 LAB — BASIC METABOLIC PANEL
BUN: 23 mg/dL (ref 6–23)
CO2: 29 mEq/L (ref 19–32)
Calcium: 9.3 mg/dL (ref 8.4–10.5)
Chloride: 104 mEq/L (ref 96–112)
Creatinine, Ser: 1.5 mg/dL (ref 0.4–1.5)
Glucose, Bld: 105 mg/dL — ABNORMAL HIGH (ref 70–99)

## 2012-04-27 LAB — TSH: TSH: 1.05 u[IU]/mL (ref 0.35–5.50)

## 2012-04-27 LAB — CBC WITH DIFFERENTIAL/PLATELET
Basophils Absolute: 0 10*3/uL (ref 0.0–0.1)
Basophils Relative: 1.1 % (ref 0.0–3.0)
HCT: 40.5 % (ref 39.0–52.0)
Hemoglobin: 13.7 g/dL (ref 13.0–17.0)
Lymphocytes Relative: 17.7 % (ref 12.0–46.0)
Lymphs Abs: 0.8 10*3/uL (ref 0.7–4.0)
Monocytes Relative: 7.1 % (ref 3.0–12.0)
Neutro Abs: 2.7 10*3/uL (ref 1.4–7.7)
RBC: 4.34 Mil/uL (ref 4.22–5.81)
RDW: 12.8 % (ref 11.5–14.6)

## 2012-04-27 LAB — LIPID PANEL
LDL Cholesterol: 128 mg/dL — ABNORMAL HIGH (ref 0–99)
Total CHOL/HDL Ratio: 4

## 2012-04-27 LAB — HEPATIC FUNCTION PANEL
AST: 21 U/L (ref 0–37)
Albumin: 4.3 g/dL (ref 3.5–5.2)
Total Bilirubin: 0.9 mg/dL (ref 0.3–1.2)

## 2012-04-27 LAB — PSA: PSA: 0.59 ng/mL (ref 0.10–4.00)

## 2012-05-03 ENCOUNTER — Telehealth: Payer: Self-pay | Admitting: Internal Medicine

## 2012-05-03 ENCOUNTER — Ambulatory Visit (INDEPENDENT_AMBULATORY_CARE_PROVIDER_SITE_OTHER): Payer: Managed Care, Other (non HMO) | Admitting: Internal Medicine

## 2012-05-03 ENCOUNTER — Encounter: Payer: Self-pay | Admitting: Internal Medicine

## 2012-05-03 VITALS — BP 102/74 | HR 68 | Temp 98.2°F | Ht 69.0 in | Wt 163.0 lb

## 2012-05-03 DIAGNOSIS — Z0000 Encounter for general adult medical examination without abnormal findings: Secondary | ICD-10-CM

## 2012-05-03 DIAGNOSIS — Z23 Encounter for immunization: Secondary | ICD-10-CM

## 2012-05-03 DIAGNOSIS — Z2911 Encounter for prophylactic immunotherapy for respiratory syncytial virus (RSV): Secondary | ICD-10-CM

## 2012-05-03 MED ORDER — FLUTICASONE PROPIONATE 50 MCG/ACT NA SUSP: 2.0000 | Freq: Every day | NASAL | Status: DC

## 2012-05-03 MED ORDER — ALPRAZOLAM 0.5 MG PO TABS: 0.5000 mg | ORAL_TABLET | Freq: Every day | ORAL | Status: DC | PRN

## 2012-05-03 MED ORDER — SIMVASTATIN 10 MG PO TABS: 10.0000 mg | ORAL_TABLET | Freq: Every day | ORAL | Status: DC

## 2012-05-03 MED ORDER — PAROXETINE HCL ER 25 MG PO TB24: 25.0000 mg | ORAL_TABLET | ORAL | Status: DC

## 2012-05-03 NOTE — Addendum Note (Signed)
Addended by: Alfred Levins D on: 05/03/2012 09:16 AM   Modules accepted: Orders

## 2012-05-03 NOTE — Telephone Encounter (Signed)
Pt needs fluticasone 59mcg/act nasal spray. One month supply to [phone] (was going to [place])

## 2012-05-03 NOTE — Telephone Encounter (Signed)
rx sent in electronically 

## 2012-05-03 NOTE — Addendum Note (Signed)
Addended by: Alfred Levins D on: 05/03/2012 08:59 AM   Modules accepted: Orders

## 2012-05-03 NOTE — Progress Notes (Signed)
Patient ID: Ryan Harris, male   DOB: 03/03/1956, 56 y.o.   MRN: [id number] cpx Pt requested shingles vaccine  Past Medical History  Diagnosis Date  . Allergic rhinitis   . Depression   . Leukopenia     chronic  . Cryptogenic stroke   . Patent foramen ovale     with R-to-L shunting by TEE 02/2011. s/p successful transcatheter closure of PFO with 20mm Gore septal occluder device 07/29/11 (enrolled in [place] Helix REDUCE trial)    History   Social History  . Marital Status: Married    Spouse Name: N/A    Number of Children: 2  . Years of Education: N/A   Occupational History  . Solicitor    Social History Main Topics  . Smoking status: Never Smoker   . Smokeless tobacco: Never Used  . Alcohol Use: Yes     Comment: Rare--beer  . Drug Use: No  . Sexually Active: Yes   Other Topics Concern  . Not on file   Social History Narrative   No regular exercise    Past Surgical History  Procedure Date  . Patent foramen ovale closure 07/29/2011    Family History  Problem Relation Age of Onset  . Heart attack Mother     mild  . Stroke Father   . Coronary artery disease Father   . Coronary artery disease Sister     < 60  -- had stent placement at 50  . Hypertension Brother   . Stroke Paternal Uncle     x2  . Aneurysm Paternal Uncle   . Heart attack Paternal Uncle     No Known Allergies  Current Outpatient Prescriptions on File Prior to Visit  Medication Sig Dispense Refill  . ALPRAZolam (XANAX) 0.5 MG tablet Take 1 tablet (0.5 mg total) by mouth daily as needed.  30 tablet  0  . amoxicillin (AMOXIL) 500 MG tablet Take 4 tablets by mouth one hour prior to dental procedure  8 tablet  1  . aspirin 81 MG tablet Take 1 tablet (81 mg total) by mouth daily.      Marland Kitchen loratadine (CLARITIN) 10 MG tablet Take 10 mg by mouth daily.       Marland Kitchen PARoxetine (PAXIL-CR) 25 MG 24 hr tablet Take 25 mg by mouth every morning.      . simvastatin (ZOCOR) 10 MG tablet TAKE ONE TABLET BY MOUTH  AT BEDTIME  90 tablet  1     patient denies chest pain, shortness of breath, orthopnea. Denies lower extremity edema, abdominal pain, change in appetite, change in bowel movements. Patient denies rashes, musculoskeletal complaints. No other specific complaints in a complete review of systems.   BP 102/74  Pulse 68  Temp 98.2 F (36.8 C) (Oral)  Ht 5\' 9"  (1.753 m)  Wt 163 lb (73.936 kg)  BMI 24.07 kg/m2  well-developed well-nourished male in no acute distress. HEENT exam atraumatic, normocephalic, neck supple without jugular venous distention. Chest clear to auscultation cardiac exam S1-S2 are regular. Abdominal exam overweight with bowel sounds, soft and nontender. Extremities no edema. Neurologic exam is alert with a normal gait.  A/p- well visit - health maint utd

## 2012-05-04 ENCOUNTER — Encounter: Payer: Managed Care, Other (non HMO) | Admitting: Internal Medicine

## 2012-08-11 ENCOUNTER — Other Ambulatory Visit: Payer: Self-pay

## 2012-08-11 DIAGNOSIS — Q211 Atrial septal defect: Secondary | ICD-10-CM

## 2012-08-16 ENCOUNTER — Other Ambulatory Visit (HOSPITAL_COMMUNITY): Payer: Managed Care, Other (non HMO)

## 2012-08-17 ENCOUNTER — Telehealth: Payer: Self-pay | Admitting: Cardiovascular Disease

## 2012-08-17 ENCOUNTER — Encounter (HOSPITAL_COMMUNITY): Payer: Self-pay

## 2012-08-17 NOTE — Telephone Encounter (Signed)
Pt's wife called because pt is having an EKG Fluoroscopy  Tomorrow, wife states [employer] needs a code so he can pay for it. I  spoke with the pre-cert coordinator charmine hall, which said to call the cath lab to see what procedure pt is scheduled to have, but for the Cath the CPT is 93458. The cath lab states the pt is only  having an xray to view post  PFO closure. Pt's wife aware also CPT for cath. Number given.

## 2012-08-17 NOTE — Telephone Encounter (Signed)
New Problem:    Patient's wife called wanting to cancel his procedure tomorrow due to his insurance company refusing to pay.  Please call back.

## 2012-08-17 NOTE — Telephone Encounter (Signed)
New problem   Per pts wife she needs a procedure code for procedure being done at [facility] tomorrow

## 2012-08-17 NOTE — Telephone Encounter (Signed)
Pt is scheduled to have an Xray post PFO closure tomorrow in the cath lab. Pt's wife stacy states that this is a research study, the pt is to pay for it, and [employer] refused to pay for it. Pt's wife called to Cancell this procedure for tomorrow. Felicia in the cath lab aware of test being cancelled.

## 2012-08-18 ENCOUNTER — Encounter (HOSPITAL_COMMUNITY): Admission: RE | Payer: Self-pay | Source: Ambulatory Visit

## 2012-08-18 ENCOUNTER — Ambulatory Visit (HOSPITAL_COMMUNITY)
Admission: RE | Admit: 2012-08-18 | Payer: Managed Care, Other (non HMO) | Source: Ambulatory Visit | Admitting: Cardiovascular Disease

## 2012-08-18 SURGERY — VENOGRAM

## 2012-08-19 ENCOUNTER — Other Ambulatory Visit (HOSPITAL_COMMUNITY): Payer: Managed Care, Other (non HMO)

## 2012-08-22 ENCOUNTER — Telehealth: Payer: Self-pay | Admitting: Cardiovascular Disease

## 2012-08-22 NOTE — Telephone Encounter (Signed)
Spoke w/pt's wife, Kennyth Arnold, on Friday, 08-19-12.  She was  inquiring CPT codes for procedures pt was scheduled to have.  Gave CPT code 29562 for 2D echo w/ bubble.  Pt was also to have a cath w/flouroscopy.  I gave her the CPT code 13086 for the heart cath.  I explained I did not have the particular code for the fluroscopy portion but she was satisfied with the cath code.  She also wanted to know how much each would cost as her husband was in [place] [facility] research study and she only had $1,150 left in research funds to pay for these two procedures that were required for the study.  The pt had canceled these two tests previously until they could find out if they financially afford these tests and continue in the study.  She thought her husband was also scheduled for a "3D echo" but I didn't see where he had been set up for these.  I advised I would ask Lauren, Dr. Earmon Phoenix nurse, what tests pt should be scheduled for and call her back on Monday and she was okay to wait until then.  I spoke called Mrs. Gorum today and advised her I had spoken with Leotis Shames and she said she only knew about the 2D echo w/bubble study and cath w/flouroscopy.  If she had any other questions about what tests, etc, her husband needed as a result of the research procedure, they would needed to speak with [facility] as they were the authors of the study.

## 2012-08-22 NOTE — Telephone Encounter (Signed)
ERROR

## 2012-12-03 ENCOUNTER — Encounter: Payer: Self-pay | Admitting: *Deleted

## 2012-12-12 ENCOUNTER — Encounter: Payer: Self-pay | Admitting: Neurology

## 2013-02-09 ENCOUNTER — Encounter: Payer: Self-pay | Admitting: Neurology

## 2013-02-13 ENCOUNTER — Encounter: Payer: Self-pay | Admitting: Neurology

## 2013-02-13 ENCOUNTER — Ambulatory Visit (INDEPENDENT_AMBULATORY_CARE_PROVIDER_SITE_OTHER): Payer: Self-pay | Admitting: Neurology

## 2013-02-13 ENCOUNTER — Other Ambulatory Visit: Payer: Self-pay | Admitting: *Deleted

## 2013-02-13 VITALS — BP 130/82 | HR 57 | Temp 97.8°F | Resp 18 | Wt 159.2 lb

## 2013-02-13 DIAGNOSIS — I6789 Other cerebrovascular disease: Secondary | ICD-10-CM

## 2013-02-13 MED ORDER — PAROXETINE HCL ER 25 MG PO TB24: 25.0000 mg | ORAL_TABLET | ORAL | Status: DC

## 2013-02-14 NOTE — Progress Notes (Addendum)
Patient was seen today for 18 mont follow-up in the GORE REDUCE study. No reported SAE's or AE's at this visit. No changes in medication since last visit. Dr. Pearlean Brownie performed neuro exam, NIHSS with a score of 0 and modified rankin with a score of 0. No new neurological findings were identified. ECG was performed which showed Sinus Bradycardia, but that was determine to not be clinical significant. Next appointment was scheduled and patient was instructed if he have any questions or concerns to call our office. Patient verbalized that he understood.    Neurological Exam ;  Awake  Alert oriented x 3. Normal speech and language.eye movements full without nystagmus.fundi were not visualized. Vision acuity and fields appear normal. Hearing is normal. Palatal movements are normal. Face symmetric. Tongue midline. Normal strength, tone, reflexes and coordination. Normal sensation. Gait deferred.  NIHSS 0  Delia Heady, MD

## 2013-02-15 ENCOUNTER — Other Ambulatory Visit: Payer: Self-pay | Admitting: *Deleted

## 2013-02-15 MED ORDER — SIMVASTATIN 10 MG PO TABS: 10.0000 mg | ORAL_TABLET | Freq: Every day | ORAL | Status: DC

## 2013-02-15 MED ORDER — FLUTICASONE PROPIONATE 50 MCG/ACT NA SUSP: 2.0000 | Freq: Every day | NASAL | Status: DC

## 2013-05-01 ENCOUNTER — Other Ambulatory Visit (INDEPENDENT_AMBULATORY_CARE_PROVIDER_SITE_OTHER): Payer: Managed Care, Other (non HMO)

## 2013-05-01 DIAGNOSIS — Z0000 Encounter for general adult medical examination without abnormal findings: Secondary | ICD-10-CM

## 2013-05-01 LAB — BASIC METABOLIC PANEL
CO2: 28 mEq/L (ref 19–32)
Calcium: 9 mg/dL (ref 8.4–10.5)
Glucose, Bld: 93 mg/dL (ref 70–99)
Potassium: 4.5 mEq/L (ref 3.5–5.1)
Sodium: 141 mEq/L (ref 135–145)

## 2013-05-01 LAB — POCT URINALYSIS DIPSTICK
Blood, UA: NEGATIVE
Protein, UA: NEGATIVE
Spec Grav, UA: >=1.03
Urobilinogen, UA: 1
pH, UA: 6

## 2013-05-01 LAB — HEPATIC FUNCTION PANEL
AST: 18 U/L (ref 0–37)
Albumin: 4.2 g/dL (ref 3.5–5.2)
Alkaline Phosphatase: 54 U/L (ref 39–117)
Total Protein: 6.6 g/dL (ref 6.0–8.3)

## 2013-05-01 LAB — CBC WITH DIFFERENTIAL/PLATELET
Basophils Relative: 0.8 % (ref 0.0–3.0)
HCT: 40.1 % (ref 39.0–52.0)
Hemoglobin: 13.7 g/dL (ref 13.0–17.0)
Lymphocytes Relative: 22.7 % (ref 12.0–46.0)
Lymphs Abs: 0.9 10*3/uL (ref 0.7–4.0)
MCHC: 34.2 g/dL (ref 30.0–36.0)
Monocytes Relative: 8.9 % (ref 3.0–12.0)
Neutro Abs: 2.3 10*3/uL (ref 1.4–7.7)
RBC: 4.39 Mil/uL (ref 4.22–5.81)

## 2013-05-01 LAB — LIPID PANEL
LDL Cholesterol: 133 mg/dL — ABNORMAL HIGH (ref 0–99)
Total CHOL/HDL Ratio: 4

## 2013-05-01 LAB — TSH: TSH: 1.54 u[IU]/mL (ref 0.35–5.50)

## 2013-05-02 ENCOUNTER — Ambulatory Visit: Payer: Managed Care, Other (non HMO)

## 2013-05-02 DIAGNOSIS — Z8042 Family history of malignant neoplasm of prostate: Secondary | ICD-10-CM

## 2013-05-02 LAB — PSA: PSA: 0.59 ng/mL (ref 0.10–4.00)

## 2013-05-12 ENCOUNTER — Encounter: Payer: Self-pay | Admitting: Family

## 2013-05-12 ENCOUNTER — Ambulatory Visit (INDEPENDENT_AMBULATORY_CARE_PROVIDER_SITE_OTHER): Payer: Managed Care, Other (non HMO) | Admitting: Family

## 2013-05-12 VITALS — BP 124/82 | HR 64 | Ht 69.0 in | Wt 163.0 lb

## 2013-05-12 DIAGNOSIS — M25511 Pain in right shoulder: Secondary | ICD-10-CM

## 2013-05-12 DIAGNOSIS — Z0000 Encounter for general adult medical examination without abnormal findings: Secondary | ICD-10-CM

## 2013-05-12 DIAGNOSIS — M25519 Pain in unspecified shoulder: Secondary | ICD-10-CM

## 2013-05-12 DIAGNOSIS — Z23 Encounter for immunization: Secondary | ICD-10-CM

## 2013-05-12 MED ORDER — FLUTICASONE PROPIONATE 50 MCG/ACT NA SUSP: 2.0000 | Freq: Every day | NASAL | Status: DC

## 2013-05-12 MED ORDER — PAROXETINE HCL ER 25 MG PO TB24: 25.0000 mg | ORAL_TABLET | ORAL | Status: DC

## 2013-05-12 MED ORDER — SIMVASTATIN 10 MG PO TABS: 10.0000 mg | ORAL_TABLET | Freq: Every day | ORAL | Status: DC

## 2013-05-12 MED ORDER — ALPRAZOLAM 0.5 MG PO TABS: 0.5000 mg | ORAL_TABLET | Freq: Every day | ORAL | Status: DC | PRN

## 2013-05-12 NOTE — Patient Instructions (Signed)
Osteoarthritis Osteoarthritis is the most common form of arthritis. It is redness, soreness, and swelling (inflammation) affecting the cartilage. Cartilage acts as a cushion, covering the ends of bones where they meet to form a joint. CAUSES  Over time, the cartilage begins to wear away. This causes bone to rub on bone. This produces pain and stiffness in the affected joints. Factors that contribute to this problem are:  Excessive body weight.  Age.  Overuse of joints. SYMPTOMS   People with osteoarthritis usually experience joint pain, swelling, or stiffness.  Over time, the joint may lose its normal shape.  Small deposits of bone (osteophytes) may grow on the edges of the joint.  Bits of bone or cartilage can break off and float inside the joint space. This may cause more pain and damage.  Osteoarthritis can lead to depression, anxiety, feelings of helplessness, and limitations on daily activities. The most commonly affected joints are in the:  Ends of the fingers.  Thumbs.  Neck.  Lower back.  Knees.  Hips. DIAGNOSIS  Diagnosis is mostly based on your symptoms and exam. Tests may be helpful, including:  X-rays of the affected joint.  A computerized magnetic scan (MRI).  Blood tests to rule out other types of arthritis.  Joint fluid tests. This involves using a needle to draw fluid from the joint and examining the fluid under a microscope. TREATMENT  Goals of treatment are to control pain, improve joint function, maintain a normal body weight, and maintain a healthy lifestyle. Treatment approaches may include:  A prescribed exercise program with rest and joint relief.  Weight control with nutritional education.  Pain relief techniques such as:  Properly applied heat and cold.  Electric pulses delivered to nerve endings under the skin (transcutaneous electrical nerve stimulation, TENS).  Massage.  Certain supplements. Ask your caregiver before using any  supplements, especially in combination with prescribed drugs.  Medicines to control pain, such as:  Acetaminophen.  Nonsteroidal anti-inflammatory drugs (NSAIDs), such as naproxen.  Narcotic or central-acting agents, such as tramadol. This drug carries a risk of addiction and is generally prescribed for short-term use.  Corticosteroids. These can be given orally or as injection. This is a short-term treatment, not recommended for routine use.  Surgery to reposition the bones and relieve pain (osteotomy) or to remove loose pieces of bone and cartilage. Joint replacement may be needed in advanced states of osteoarthritis. HOME CARE INSTRUCTIONS  Your caregiver can recommend specific types of exercise. These may include:  Strengthening exercises. These are done to strengthen the muscles that support joints affected by arthritis. They can be performed with weights or with exercise bands to add resistance.  Aerobic activities. These are exercises, such as brisk walking or low-impact aerobics, that get your heart pumping. They can help keep your lungs and circulatory system in shape.  Range-of-motion activities. These keep your joints limber.  Balance and agility exercises. These help you maintain daily living skills. Learning about your condition and being actively involved in your care will help improve the course of your osteoarthritis. SEEK MEDICAL CARE IF:   You feel hot or your skin turns red.  You develop a rash in addition to your joint pain.  You have an oral temperature above 102 F (38.9 C). FOR MORE INFORMATION  National Institute of Arthritis and Musculoskeletal and Skin Diseases: www.niams.nih.gov National Institute on Aging: www.nia.nih.gov American College of Rheumatology: www.rheumatology.org Document Released: 05/11/2005 Document Revised: 08/03/2011 Document Reviewed: 08/22/2009 [employer] Patient Information 2014 [employer].  

## 2013-05-12 NOTE — Progress Notes (Signed)
Subjective:    Patient ID: Ryan Harris, male    DOB: [birth year], 57 y.o.   MRN: [id number]  HPI 57 year old WM, nonsmoker,  Patient of Dr. Cato Mulligan presents for yearly preventative medicine examination. All immunizations and health maintenance protocols were reviewed with the patient and they are up to date with these protocols. Screening laboratory values were reviewed with the patient including screening of hyperlipidemia PSA renal function and hepatic function. There medications past medical history social history problem list and allergies were reviewed in detail. Goals were established with regard to weight loss exercise diet in compliance with medications  Patient concerns of right shoulder pain that occurs at times, particularly at bedtime. He rates the pain in 3-4/10, worse with movement. Denies any injury. Denies any swelling. Takes Aleve and the pain typically goes away. Review of Systems  Constitutional: Negative.   HENT: Negative.   Eyes: Negative.   Respiratory: Negative.   Cardiovascular: Negative.   Gastrointestinal: Negative.   Endocrine: Negative.   Genitourinary: Negative.   Musculoskeletal: Negative.   Skin: Negative.   Allergic/Immunologic: Negative.   Neurological: Negative.   Hematological: Negative.    Past Medical History  Diagnosis Date  . Allergic rhinitis   . Depression   . Leukopenia     chronic  . Cryptogenic stroke   . Patent foramen ovale     with R-to-L shunting by TEE 02/2011. s/p successful transcatheter closure of PFO with 20mm Gore septal occluder device 07/29/11 (enrolled in [employer] REDUCE trial)    History   Social History  . Marital Status: Married    Spouse Name: N/A    Number of Children: 2  . Years of Education: N/A   Occupational History  . Solicitor    Social History Main Topics  . Smoking status: Never Smoker   . Smokeless tobacco: Never Used  . Alcohol Use: 0.6 oz/week    1 Glasses of wine per week     Comment:  Rare--beer  . Drug Use: No  . Sexual Activity: Yes   Other Topics Concern  . Not on file   Social History Narrative   No regular exercise    Past Surgical History  Procedure Laterality Date  . Patent foramen ovale closure  07/29/2011    Family History  Problem Relation Age of Onset  . Heart attack Mother     mild  . Stroke Father   . Coronary artery disease Father   . Coronary artery disease Sister     < 60  -- had stent placement at 50  . Hypertension Brother   . Stroke Paternal Uncle     x2  . Aneurysm Paternal Uncle   . Heart attack Paternal Uncle     No Known Allergies  Current Outpatient Prescriptions on File Prior to Visit  Medication Sig Dispense Refill  . aspirin EC 81 MG tablet Take 81 mg by mouth every morning.       No current facility-administered medications on file prior to visit.    BP 124/82  Pulse 64  Ht 5\' 9"  (1.753 m)  Wt 163 lb (73.936 kg)  BMI 24.06 kg/m2chart    Objective:   Physical Exam  Constitutional: He is oriented to person, place, and time. He appears well-developed and well-nourished.  HENT:  Head: Normocephalic and atraumatic.  Right Ear: External ear normal.  Left Ear: External ear normal.  Nose: Nose normal.  Mouth/Throat: Oropharynx is clear and moist.  Eyes:  Conjunctivae and EOM are normal. Pupils are equal, round, and reactive to light.  Neck: Normal range of motion. Neck supple. No thyromegaly present.  Cardiovascular: Normal rate, regular rhythm and normal heart sounds.   Pulmonary/Chest: Effort normal and breath sounds normal.  Abdominal: Soft. Bowel sounds are normal. He exhibits no distension. There is no tenderness. There is no rebound.  Genitourinary: Rectum normal, prostate normal and penis normal.  Musculoskeletal: Normal range of motion. He exhibits no edema and no tenderness.  Neurological: He is alert and oriented to person, place, and time. He has normal reflexes. No cranial nerve deficit. Coordination  normal.  Skin: Skin is warm and dry.  Psychiatric: He has a normal mood and affect.          Assessment & Plan:  Assessment: 1. Complete physical exam 2. Right shoulder pain  Plan: Continue current medications. Encouraged healthy diet, exercise, low sodium, low cholesterol diet. Consider x-ray of the right shoulder pain persists. Encouraged over-the-counter Aleve as needed with food. Call the office with any questions or concerns. Recheck in 6 months and sooner as needed.

## 2013-06-02 ENCOUNTER — Other Ambulatory Visit: Payer: Self-pay | Admitting: Internal Medicine

## 2013-07-25 ENCOUNTER — Other Ambulatory Visit: Payer: Self-pay | Admitting: Neurology

## 2013-07-25 DIAGNOSIS — I6350 Cerebral infarction due to unspecified occlusion or stenosis of unspecified cerebral artery: Secondary | ICD-10-CM

## 2013-08-09 ENCOUNTER — Encounter (INDEPENDENT_AMBULATORY_CARE_PROVIDER_SITE_OTHER): Payer: Self-pay

## 2013-08-09 ENCOUNTER — Ambulatory Visit (INDEPENDENT_AMBULATORY_CARE_PROVIDER_SITE_OTHER): Payer: Self-pay

## 2013-08-09 DIAGNOSIS — I6350 Cerebral infarction due to unspecified occlusion or stenosis of unspecified cerebral artery: Secondary | ICD-10-CM

## 2013-08-09 DIAGNOSIS — Z0289 Encounter for other administrative examinations: Secondary | ICD-10-CM

## 2013-08-10 MED ORDER — GADOPENTETATE DIMEGLUMINE 469.01 MG/ML IV SOLN: 15.0000 mL | Freq: Once | INTRAVENOUS | Status: AC | PRN

## 2013-09-06 ENCOUNTER — Other Ambulatory Visit: Payer: Self-pay | Admitting: Family

## 2013-10-20 IMAGING — CT CT ANGIO NECK
1 of 9 series · 6 of 33 positions shown · IV contrast (CONTRAST)
Comparison: CT and MRI examinations 03/21/2011

CTA NECK

CLINICAL DATA: Falling.  Difficulty with motor control.  Left-
sided weakness.

CT ANGIOGRAPHY HEAD AND NECK
TECHNIQUE: Multidetector CT imaging of the head and neck was
performed using the standard protocol during bolus administration
of intravenous contrast.  Multiplanar CT image reconstructions
including MIPs were obtained to evaluate the vascular anatomy.
Carotid stenosis measurements (when applicable) are obtained
utilizing NASCET criteria, using the distal internal carotid
diameter as the denominator.
Contrast: 50mL OMNIPAQUE IOHEXOL 350 MG/ML IV SOLN

[mpr, ax 1x1 mpr, axial · axial · 0.43mm/px · z∈[-27,+219]mm · 6 of 346 slices shown]
[im 50/346  soft-tissue]
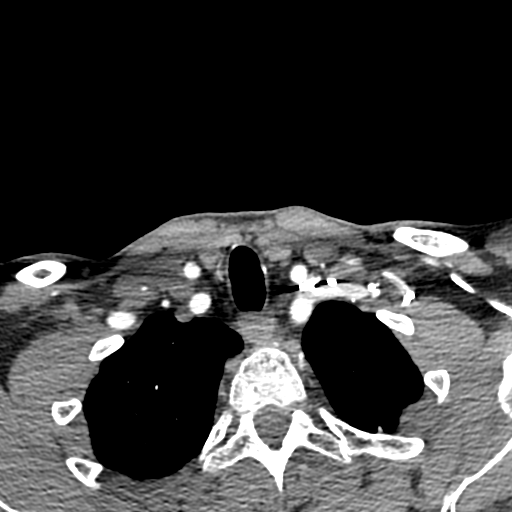
[im 99/346  bone]
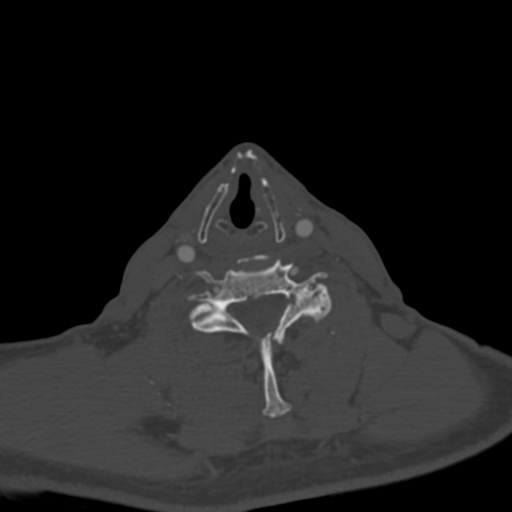
[im 148/346  soft-tissue]
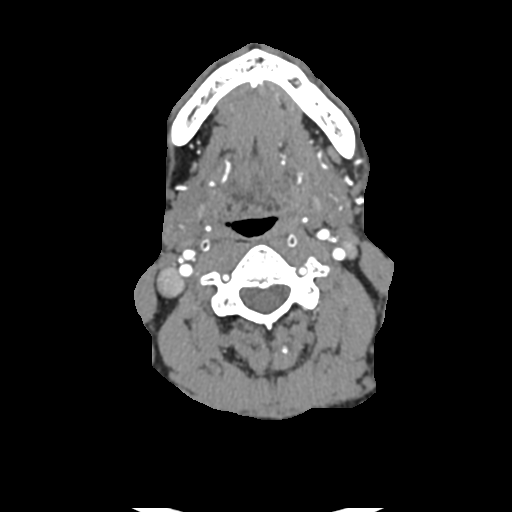
[im 198/346  bone]
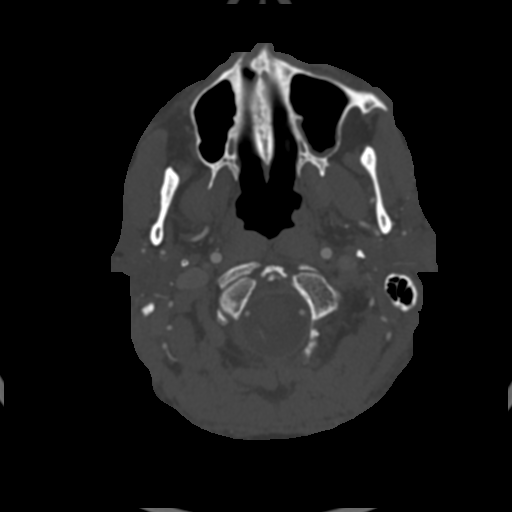
[im 247/346  soft-tissue]
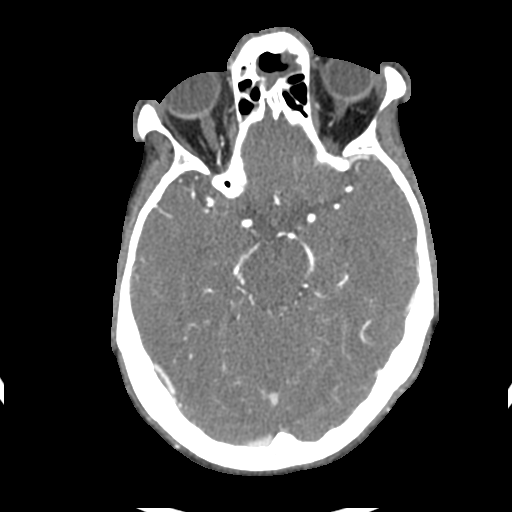
[im 296/346  bone]
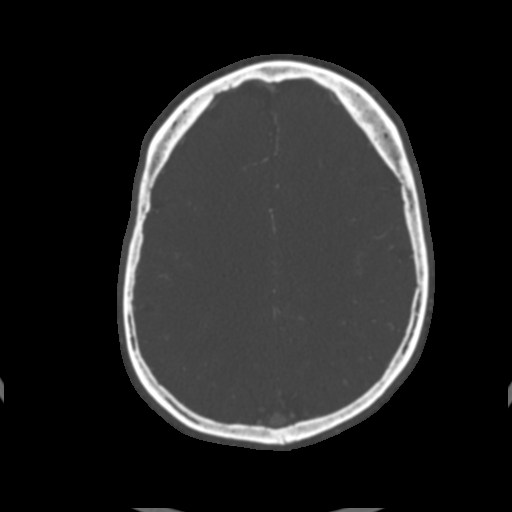

[6 of 33 positions shown; findings below may reference images not displayed]

FINDINGS: The aorta shows ectasia and tortuosity.  The branching
pattern of the brachiocephalic vessels from the arch is normal.  No
origin stenosis.

The right common carotid artery is widely patent to the
bifurcation.  The carotid bifurcation appears normal without soft
or hard plaque.  No stenosis or irregularity.  Cervical ICA appears
normal.

The left common carotid arteries widely patent to the bifurcation.
The carotid bifurcation appears normal without soft or hard plaque.
No stenosis or irregularity.

Both vertebral artery origins appear widely patent.  The vertebral
arteries are patent through the cervical region.  No evidence of
stenosis or dissection.

Soft tissues of the neck appear unremarkable.  Lung apices are
clear.  There is degenerative spondylosis at C5-6 and there is
degenerative facet arthropathy on the right at C4-5, C5-6 and C6-7
and on the left at C6-7 and C7-T1.

 Review of the MIP images confirms the above findings.
IMPRESSION: Ectasia and tortuosity of the thoracic aorta.  No demonstrable
plaque or brachiocephalic vessel origins stenosis.

Brachiocephalic vessels appear normal through the neck region.  No
carotid stenoses were evidence of dissection.

CTA HEAD
FINDINGS: Both internal carotid arteries appear widely patent
through the siphon regions.  There is mild tortuosity but no
stenosis or irregularity.  Supraclinoid internal carotid arteries
appear normal.  The anterior and middle cerebral arteries appear
normal without proximal stenosis, aneurysm or vascular
malformation.  Both PCA taking a fetal origin from the anterior
circulation.

Because of the PCA fetal origins, the posterior circulation is
diminutive.  Both vertebral arteries are patent to the basilar.
There is an incidental fenestration of the distal left vertebral
artery.  The basilar is small.  The basilar terminates in the
superior cerebellar arteries.

 Review of the MIP images confirms the above findings.
IMPRESSION: No intracranial stenosis or demonstrable vessel occlusion.  Fetal
origin of both posterior cerebral arteries from the anterior
circulation.  Incidental fenestration of the distal left vertebral
artery.

## 2013-11-10 ENCOUNTER — Other Ambulatory Visit: Payer: Self-pay | Admitting: Internal Medicine

## 2013-12-29 ENCOUNTER — Telehealth: Payer: Self-pay | Admitting: Internal Medicine

## 2013-12-29 NOTE — Telephone Encounter (Signed)
ok 

## 2013-12-29 NOTE — Telephone Encounter (Signed)
Pt would like to switch to dr kwiatkowski °

## 2014-01-01 NOTE — Telephone Encounter (Signed)
lmom for wife to cb

## 2014-01-01 NOTE — Telephone Encounter (Signed)
Pt is aware new md DR K

## 2014-03-24 ENCOUNTER — Encounter: Payer: Self-pay | Admitting: Internal Medicine

## 2014-03-27 ENCOUNTER — Encounter: Payer: Self-pay | Admitting: Internal Medicine

## 2014-04-17 ENCOUNTER — Other Ambulatory Visit: Payer: Self-pay | Admitting: Internal Medicine

## 2014-04-17 MED ORDER — SIMVASTATIN 10 MG PO TABS
10.0000 mg | ORAL_TABLET | Freq: Every day | ORAL | Status: DC
Start: 2014-04-17 — End: 2014-10-15

## 2014-04-17 NOTE — Telephone Encounter (Signed)
Rx sent to pharmacy; noted pt needs appt for future refills.

## 2014-04-17 NOTE — Telephone Encounter (Signed)
[place], [place] [phone] is requesting re-fill on simvastatin (ZOCOR) 10 MG tablet

## 2014-05-03 ENCOUNTER — Encounter (HOSPITAL_COMMUNITY): Payer: Self-pay | Admitting: Cardiovascular Disease

## 2014-07-30 ENCOUNTER — Telehealth: Payer: Self-pay | Admitting: Neurology

## 2014-07-30 NOTE — Telephone Encounter (Signed)
I spoke to the patient's wife in re the patient's visit for the [place] scheduled for [id number]. This appointment will be rescheduled. The patient's wife will contact the patient's secretary to reschedule this appointment.

## 2014-08-10 ENCOUNTER — Ambulatory Visit (INDEPENDENT_AMBULATORY_CARE_PROVIDER_SITE_OTHER): Payer: Self-pay | Admitting: Neurology

## 2014-08-10 DIAGNOSIS — Q2112 Patent foramen ovale: Secondary | ICD-10-CM

## 2014-08-10 DIAGNOSIS — Q211 Atrial septal defect: Secondary | ICD-10-CM

## 2014-08-10 NOTE — Progress Notes (Signed)
REDUCE PFO Closure Trial Study Visit  Patient is seen today for the 3 year study visit. He continues to do well without recurrent stroke or TIA symptoms. He remains on aspirin which is tolerating well. He is had no medical illnesses, medication changes or hospitalizations or emergency room visits since her last visit.  He has no complaints today.   . Afebrile. Head is nontraumatic. Neck is supple without bruit.    Cardiac exam no murmur or gallop. Lungs are clear to auscultation. Distal pulses are well felt. Neurological Exam ;  Awake  Alert oriented x 3. Normal speech and language.eye movements full without nystagmus.fundi were not visualized. Vision acuity and fields appear normal. Hearing is normal. Palatal movements are normal. Face symmetric. Tongue midline. Normal strength, tone, reflexes and coordination. Normal sensation. Gait normal  NIHSS 0 Modified Rankin Scale 0  Patient was advised to continue present treatment and follow-up as per study protocol in a year

## 2014-08-13 ENCOUNTER — Encounter: Payer: Self-pay | Admitting: Neurology

## 2014-08-20 ENCOUNTER — Encounter: Payer: Self-pay | Admitting: Internal Medicine

## 2014-09-16 ENCOUNTER — Other Ambulatory Visit: Payer: Self-pay | Admitting: Internal Medicine

## 2014-10-15 ENCOUNTER — Other Ambulatory Visit: Payer: Self-pay | Admitting: *Deleted

## 2014-10-15 ENCOUNTER — Encounter: Payer: Self-pay | Admitting: Internal Medicine

## 2014-10-15 ENCOUNTER — Ambulatory Visit (INDEPENDENT_AMBULATORY_CARE_PROVIDER_SITE_OTHER): Payer: Managed Care, Other (non HMO) | Admitting: Internal Medicine

## 2014-10-15 VITALS — BP 132/88 | HR 59 | Temp 98.0°F | Resp 18 | Ht 68.25 in | Wt 161.0 lb

## 2014-10-15 DIAGNOSIS — Z0000 Encounter for general adult medical examination without abnormal findings: Secondary | ICD-10-CM

## 2014-10-15 DIAGNOSIS — Q211 Atrial septal defect: Secondary | ICD-10-CM

## 2014-10-15 DIAGNOSIS — I639 Cerebral infarction, unspecified: Secondary | ICD-10-CM

## 2014-10-15 DIAGNOSIS — Q2112 Patent foramen ovale: Secondary | ICD-10-CM

## 2014-10-15 DIAGNOSIS — R0602 Shortness of breath: Secondary | ICD-10-CM | POA: Diagnosis not present

## 2014-10-15 LAB — CBC WITH DIFFERENTIAL/PLATELET
Basophils Absolute: 0 10*3/uL (ref 0.0–0.1)
Basophils Relative: 0.7 % (ref 0.0–3.0)
Eosinophils Absolute: 0.2 10*3/uL (ref 0.0–0.7)
Eosinophils Relative: 7 % — ABNORMAL HIGH (ref 0.0–5.0)
HCT: 40 % (ref 39.0–52.0)
Hemoglobin: 13.6 g/dL (ref 13.0–17.0)
LYMPHS ABS: 0.8 10*3/uL (ref 0.7–4.0)
Lymphocytes Relative: 22.6 % (ref 12.0–46.0)
MCHC: 34.1 g/dL (ref 30.0–36.0)
MCV: 91.5 fl (ref 78.0–100.0)
MONOS PCT: 7.9 % (ref 3.0–12.0)
Monocytes Absolute: 0.3 10*3/uL (ref 0.1–1.0)
Neutro Abs: 2.1 10*3/uL (ref 1.4–7.7)
Neutrophils Relative %: 61.8 % (ref 43.0–77.0)
Platelets: 261 10*3/uL (ref 150.0–400.0)
RBC: 4.37 Mil/uL (ref 4.22–5.81)
RDW: 13 % (ref 11.5–15.5)
WBC: 3.4 10*3/uL — AB (ref 4.0–10.5)

## 2014-10-15 LAB — COMPREHENSIVE METABOLIC PANEL
ALK PHOS: 62 U/L (ref 39–117)
ALT: 19 U/L (ref 0–53)
AST: 21 U/L (ref 0–37)
Albumin: 4.2 g/dL (ref 3.5–5.2)
BILIRUBIN TOTAL: 0.6 mg/dL (ref 0.2–1.2)
BUN: 15 mg/dL (ref 6–23)
CO2: 29 mEq/L (ref 19–32)
Calcium: 9.2 mg/dL (ref 8.4–10.5)
Chloride: 105 mEq/L (ref 96–112)
Creatinine, Ser: 1.09 mg/dL (ref 0.40–1.50)
GFR: 73.03 mL/min (ref >60.00)
GLUCOSE: 95 mg/dL (ref 70–99)
Potassium: 4.4 mEq/L (ref 3.5–5.1)
Sodium: 140 mEq/L (ref 135–145)
Total Protein: 6.5 g/dL (ref 6.0–8.3)

## 2014-10-15 LAB — LIPID PANEL
CHOLESTEROL: 152 mg/dL (ref 0–200)
HDL: 45 mg/dL (ref >39.00)
LDL Cholesterol: 88 mg/dL (ref 0–99)
NONHDL: 107
TRIGLYCERIDES: 96 mg/dL (ref 0.0–149.0)
Total CHOL/HDL Ratio: 3
VLDL: 19.2 mg/dL (ref 0.0–40.0)

## 2014-10-15 LAB — TSH: TSH: 1.42 u[IU]/mL (ref 0.35–4.50)

## 2014-10-15 LAB — PSA: PSA: 0.61 ng/mL (ref 0.10–4.00)

## 2014-10-15 MED ORDER — SIMVASTATIN 10 MG PO TABS: 10.0000 mg | ORAL_TABLET | Freq: Every day | ORAL | Status: DC

## 2014-10-15 MED ORDER — ALPRAZOLAM 0.5 MG PO TABS: 0.5000 mg | ORAL_TABLET | Freq: Every day | ORAL | Status: DC | PRN

## 2014-10-15 MED ORDER — PAROXETINE HCL ER 25 MG PO TB24
25.0000 mg | ORAL_TABLET | Freq: Every morning | ORAL | Status: DC
Start: 2014-10-15 — End: 2016-04-23

## 2014-10-15 NOTE — Progress Notes (Signed)
Pre visit review using our clinic review tool, if applicable. No additional management support is needed unless otherwise documented below in the visit note. 

## 2014-10-15 NOTE — Patient Instructions (Signed)
Schedule your colonoscopy to help detect colon cancer.  Health Maintenance A healthy lifestyle and preventative care can promote health and wellness.  Maintain regular health, dental, and eye exams.  Eat a healthy diet. Foods like vegetables, fruits, whole grains, low-fat dairy products, and lean protein foods contain the nutrients you need and are low in calories. Decrease your intake of foods high in solid fats, added sugars, and salt. Get information about a proper diet from your health care provider, if necessary.  Regular physical exercise is one of the most important things you can do for your health. Most adults should get at least 150 minutes of moderate-intensity exercise (any activity that increases your heart rate and causes you to sweat) each week. In addition, most adults need muscle-strengthening exercises on 2 or more days a week.   Maintain a healthy weight. The body mass index (BMI) is a screening tool to identify possible weight problems. It provides an estimate of body fat based on height and weight. Your health care provider can find your BMI and can help you achieve or maintain a healthy weight. For males 20 years and older:  A BMI below 18.5 is considered underweight.  A BMI of 18.5 to 24.9 is normal.  A BMI of 25 to 29.9 is considered overweight.  A BMI of 30 and above is considered obese.  Maintain normal blood lipids and cholesterol by exercising and minimizing your intake of saturated fat. Eat a balanced diet with plenty of fruits and vegetables. Blood tests for lipids and cholesterol should begin at age 31 and be repeated every 5 years. If your lipid or cholesterol levels are high, you are over age 1, or you are at high risk for heart disease, you may need your cholesterol levels checked more frequently.Ongoing high lipid and cholesterol levels should be treated with medicines if diet and exercise are not working.  If you smoke, find out from your health care  provider how to quit. If you do not use tobacco, do not start.  Lung cancer screening is recommended for adults aged 3-80 years who are at high risk for developing lung cancer because of a history of smoking. A yearly low-dose CT scan of the lungs is recommended for people who have at least a 30-pack-year history of smoking and are current smokers or have quit within the past 15 years. A pack year of smoking is smoking an average of 1 pack of cigarettes a day for 1 year (for example, a 30-pack-year history of smoking could mean smoking 1 pack a day for 30 years or 2 packs a day for 15 years). Yearly screening should continue until the smoker has stopped smoking for at least 15 years. Yearly screening should be stopped for people who develop a health problem that would prevent them from having lung cancer treatment.  If you choose to drink alcohol, do not have more than 2 drinks per day. One drink is considered to be 12 oz (360 mL) of beer, 5 oz (150 mL) of wine, or 1.5 oz (45 mL) of liquor.  Avoid the use of street drugs. Do not share needles with anyone. Ask for help if you need support or instructions about stopping the use of drugs.  High blood pressure causes heart disease and increases the risk of stroke. Blood pressure should be checked at least every 1-2 years. Ongoing high blood pressure should be treated with medicines if weight loss and exercise are not effective.  If you are  47-62 years old, ask your health care provider if you should take aspirin to prevent heart disease.  Diabetes screening involves taking a blood sample to check your fasting blood sugar level. This should be done once every 3 years after age 88 if you are at a normal weight and without risk factors for diabetes. Testing should be considered at a younger age or be carried out more frequently if you are overweight and have at least 1 risk factor for diabetes.  Colorectal cancer can be detected and often prevented. Most  routine colorectal cancer screening begins at the age of 84 and continues through age 53. However, your health care provider may recommend screening at an earlier age if you have risk factors for colon cancer. On a yearly basis, your health care provider may provide home test kits to check for hidden blood in the stool. A small camera at the end of a tube may be used to directly examine the colon (sigmoidoscopy or colonoscopy) to detect the earliest forms of colorectal cancer. Talk to your health care provider about this at age 90 when routine screening begins. A direct exam of the colon should be repeated every 5-10 years through age 46, unless early forms of precancerous polyps or small growths are found.  People who are at an increased risk for hepatitis B should be screened for this virus. You are considered at high risk for hepatitis B if:  You were born in a country where hepatitis B occurs often. Talk with your health care provider about which countries are considered high risk.  Your parents were born in a high-risk country and you have not received a shot to protect against hepatitis B (hepatitis B vaccine).  You have HIV or AIDS.  You use needles to inject street drugs.  You live with, or have sex with, someone who has hepatitis B.  You are a man who has sex with other men (MSM).  You get hemodialysis treatment.  You take certain medicines for conditions like cancer, organ transplantation, and autoimmune conditions.  Hepatitis C blood testing is recommended for all people born from [birth year] through 1967 and any individual with known risk factors for hepatitis C.  Healthy men should no longer receive prostate-specific antigen (PSA) blood tests as part of routine cancer screening. Talk to your health care provider about prostate cancer screening.  Testicular cancer screening is not recommended for adolescents or adult males who have no symptoms. Screening includes self-exam, a health care  provider exam, and other screening tests. Consult with your health care provider about any symptoms you have or any concerns you have about testicular cancer.  Practice safe sex. Use condoms and avoid high-risk sexual practices to reduce the spread of sexually transmitted infections (STIs).  You should be screened for STIs, including gonorrhea and chlamydia if:  You are sexually active and are younger than 24 years.  You are older than 24 years, and your health care provider tells you that you are at risk for this type of infection.  Your sexual activity has changed since you were last screened, and you are at an increased risk for chlamydia or gonorrhea. Ask your health care provider if you are at risk.  If you are at risk of being infected with HIV, it is recommended that you take a prescription medicine daily to prevent HIV infection. This is called pre-exposure prophylaxis (PrEP). You are considered at risk if:  You are a man who has sex with other  men (MSM).  You are a heterosexual man who is sexually active with multiple partners.  You take drugs by injection.  You are sexually active with a partner who has HIV.  Talk with your health care provider about whether you are at high risk of being infected with HIV. If you choose to begin PrEP, you should first be tested for HIV. You should then be tested every 3 months for as long as you are taking PrEP.  Use sunscreen. Apply sunscreen liberally and repeatedly throughout the day. You should seek shade when your shadow is shorter than you. Protect yourself by wearing long sleeves, pants, a wide-brimmed hat, and sunglasses year round whenever you are outdoors.  Tell your health care provider of new moles or changes in moles, especially if there is a change in shape or color. Also, tell your health care provider if a mole is larger than the size of a pencil eraser.  A one-time screening for abdominal aortic aneurysm (AAA) and surgical  repair of large AAAs by ultrasound is recommended for men aged 69-75 years who are current or former smokers.  Stay current with your vaccines (immunizations). Document Released: 11/07/2007 Document Revised: 05/16/2013 Document Reviewed: 10/06/2010 [facility] Patient Information 2015 [place], [place]. This information is not intended to replace advice given to you by your health care provider. Make sure you discuss any questions you have with your health care provider.

## 2014-10-15 NOTE — Progress Notes (Signed)
Subjective:    Patient ID: Ryan Harris, male    DOB: Jul 21, 1955, 59 y.o.   MRN: [id number]  HPI  Patient ID: Ryan Harris MRN: [id number], DOB/AGE: 04-Jun-1955 59 y.o. Admit date: 07/29/2011 D/C date: 07/30/2011   Primary Discharge Diagnoses:  1. PFO with R-to-L shunting by TEE 2012 - s/p successful transcatheter closure of PFO with 67mm Gore septal occluder device 07/29/11 2. H/o cryptogenic stroke  Secondary Discharge Diagnoses:  1. Hyperlipidemia 2. Leukopenia 3. Depression 4. Allergic rhinitis 5. Hx of nuclear stress test 03/2011 demonstrating small, mild fixed basal inferior perfusion defect felt most likely attenuation without ischemia      59 year old patient who is seen today for a preventive health examination.  He has a prior history of a stroke secondary to a PFO with right to left shunting.  This was corrected in 2013.  He has mild dyslipidemia and a history of mild anxiety, depression. He is still well today without concerns or complaints except for some intermittent left shoulder pain.  Pain is aggravated by activity and worse at night.  He is left-handed  Past medical history is otherwise fairly unremarkable  Family history father died at 61 from multiple CVAs Mother died at 28 with history of cancer unclear type 2 brothers 2 sisters positive for hypertension and coronary artery disease  Social history married, nonsmoker, works as a Health and safety inspector.  Very active throughout the day, but no regular exercise program  Past Medical History  Diagnosis Date  . Allergic rhinitis   . Depression   . Leukopenia     chronic  . Cryptogenic stroke   . Patent foramen ovale     with R-to-L shunting by TEE 02/2011. s/p successful transcatheter closure of PFO with 84mm Gore septal occluder device 07/29/11 (enrolled in [place] Helix REDUCE trial)    History   Social History  . Marital Status: Married    Spouse Name: N/A  . Number of Children: 2  . Years of Education: N/A     Occupational History  . Engineer, building services    Social History Main Topics  . Smoking status: Never Smoker   . Smokeless tobacco: Never Used  . Alcohol Use: 0.6 oz/week    1 Glasses of wine per week     Comment: Rare--beer  . Drug Use: No  . Sexual Activity: Yes   Other Topics Concern  . Not on file   Social History Narrative   No regular exercise    Past Surgical History  Procedure Laterality Date  . Patent foramen ovale closure  07/29/2011  . Colonoscopy    . Patent foramen ovale closure N/A 07/29/2011    Procedure: PATENT FORAMEN OVALE CLOSURE;  Surgeon: Sherren Mocha, MD;  Location: [facility] CATH LAB;  Service: Cardiovascular;  Laterality: N/A;    Family History  Problem Relation Age of Onset  . Heart attack Mother     mild  . Stroke Father   . Coronary artery disease Father   . Coronary artery disease Sister     < 60  -- had stent placement at 53  . Hypertension Brother   . Stroke Paternal Uncle     x2  . Aneurysm Paternal Uncle   . Heart attack Paternal Uncle     No Known Allergies  Current Outpatient Prescriptions on File Prior to Visit  Medication Sig Dispense Refill  . aspirin EC 81 MG tablet Take 81 mg by mouth every morning.    Marland Kitchen PARoxetine (PAXIL-CR) 25  MG 24 hr tablet TAKE 1 TABLET BY MOUTH EVERY MORNING 90 tablet 1  . simvastatin (ZOCOR) 10 MG tablet Take 1 tablet (10 mg total) by mouth at bedtime. 90 tablet 0   No current facility-administered medications on file prior to visit.    BP 132/88 mmHg  Pulse 59  Temp(Src) 98 F (36.7 C) (Oral)  Resp 18  Ht 5' 8.25" (1.734 m)  Wt 161 lb (73.029 kg)  BMI 24.29 kg/m2  SpO2 98%     Review of Systems  Constitutional: Negative for fever, chills, activity change, appetite change and fatigue.  HENT: Negative for congestion, dental problem, ear pain, hearing loss, mouth sores, rhinorrhea, sinus pressure, sneezing, tinnitus, trouble swallowing and voice change.   Eyes: Negative for photophobia, pain, redness  and visual disturbance.  Respiratory: Negative for apnea, cough, choking, chest tightness, shortness of breath and wheezing.   Cardiovascular: Negative for chest pain, palpitations and leg swelling.  Gastrointestinal: Negative for nausea, vomiting, abdominal pain, diarrhea, constipation, blood in stool, abdominal distention, anal bleeding and rectal pain.  Genitourinary: Negative for dysuria, urgency, frequency, hematuria, flank pain, decreased urine volume, discharge, penile swelling, scrotal swelling, difficulty urinating, genital sores and testicular pain.  Musculoskeletal: Negative for myalgias, back pain, joint swelling, arthralgias, gait problem, neck pain and neck stiffness.       Left shoulder pain  Skin: Negative for color change, rash and wound.  Neurological: Negative for dizziness, tremors, seizures, syncope, facial asymmetry, speech difficulty, weakness, light-headedness, numbness and headaches.  Hematological: Negative for adenopathy. Does not bruise/bleed easily.  Psychiatric/Behavioral: Negative for suicidal ideas, hallucinations, behavioral problems, confusion, sleep disturbance, self-injury, dysphoric mood, decreased concentration and agitation. The patient is not nervous/anxious.        Objective:   Physical Exam  Constitutional: He appears well-developed and well-nourished.  Blood pressure 130/80  HENT:  Head: Normocephalic and atraumatic.  Right Ear: External ear normal.  Left Ear: External ear normal.  Nose: Nose normal.  Mouth/Throat: Oropharynx is clear and moist.  Eyes: Conjunctivae and EOM are normal. Pupils are equal, round, and reactive to light. No scleral icterus.  Neck: Normal range of motion. Neck supple. No JVD present. No thyromegaly present.  Cardiovascular: Regular rhythm and intact distal pulses.  Exam reveals no gallop and no friction rub.   Murmur heard. Grade 2/6 systolic murmur loudest at the base  Pulmonary/Chest: Effort normal and breath sounds  normal. He exhibits no tenderness.  Abdominal: Soft. Bowel sounds are normal. He exhibits no distension and no mass. There is no tenderness.  Genitourinary: Prostate normal and penis normal. Guaiac negative stool.  Musculoskeletal: Normal range of motion. He exhibits no edema or tenderness.  Lymphadenopathy:    He has no cervical adenopathy.  Neurological: He is alert. He has normal reflexes. No cranial nerve deficit. Coordination normal.  Skin: Skin is warm and dry. No rash noted.  Psychiatric: He has a normal mood and affect. His behavior is normal.          Assessment & Plan:  Preventive health exam Left shoulder pain.  Patient will consider orthopedic referral  These follow-up colonoscopy  Status post closure of PFO Dyslipidemia.  Continue low intensity statin therapy Recheck one year

## 2015-07-31 ENCOUNTER — Telehealth: Payer: Self-pay

## 2015-07-31 NOTE — Telephone Encounter (Signed)
Called and spoke to subject for 48 Months Gore reduce visit.

## 2016-04-23 ENCOUNTER — Encounter: Payer: Self-pay | Admitting: Internal Medicine

## 2016-04-23 MED ORDER — PAROXETINE HCL ER 25 MG PO TB24: 25.0000 mg | ORAL_TABLET | Freq: Every morning | ORAL | 1 refills | Status: DC

## 2016-04-23 NOTE — Telephone Encounter (Signed)
Left message on voicemail Rx sent to mail order pharmacy.

## 2016-04-24 ENCOUNTER — Telehealth: Payer: Self-pay | Admitting: Internal Medicine

## 2016-04-24 NOTE — Telephone Encounter (Signed)
° ° ° °  The following rx was sent to a pharmacy he no longer uses.   PARoxetine (PAXIL-CR) 25 MG 24 hr tablet  [facility]   [phone]

## 2016-04-24 NOTE — Telephone Encounter (Signed)
Spoke to pt, asked him to verify pharmacy and phone number so I can call them on Monday because I have not heard of them. Pt said [facility] [phone]. Told him okay I will call Monday and get back to you. Pt verbalized understanding.

## 2016-04-28 MED ORDER — PAROXETINE HCL ER 25 MG PO TB24: 25.0000 mg | ORAL_TABLET | Freq: Every morning | ORAL | 0 refills | Status: DC

## 2016-04-28 MED ORDER — SIMVASTATIN 10 MG PO TABS: 10.0000 mg | ORAL_TABLET | Freq: Every day | ORAL | 0 refills | Status: DC

## 2016-04-28 NOTE — Telephone Encounter (Signed)
Spoke to pt's wife Erline Levine, told her Rx's were sent to [facility] 3 month supply only. Told her pt is overdue for physical. Erline Levine verbalized understanding and said pt did schedule for Feb. Told her okay, any problems with medication refill please let me know. Erline Levine verbalized understanding.

## 2016-06-22 ENCOUNTER — Other Ambulatory Visit: Payer: Managed Care, Other (non HMO)

## 2016-06-24 ENCOUNTER — Other Ambulatory Visit (INDEPENDENT_AMBULATORY_CARE_PROVIDER_SITE_OTHER): Payer: BLUE CROSS/BLUE SHIELD

## 2016-06-24 DIAGNOSIS — Z0000 Encounter for general adult medical examination without abnormal findings: Secondary | ICD-10-CM

## 2016-06-24 LAB — LIPID PANEL
CHOL/HDL RATIO: 4
Cholesterol: 182 mg/dL (ref 0–200)
HDL: 48.6 mg/dL (ref >39.00)
LDL Cholesterol: 117 mg/dL — ABNORMAL HIGH (ref 0–99)
NONHDL: 133.02
Triglycerides: 79 mg/dL (ref 0.0–149.0)
VLDL: 15.8 mg/dL (ref 0.0–40.0)

## 2016-06-24 LAB — POC URINALSYSI DIPSTICK (AUTOMATED)
GLUCOSE UA: NEGATIVE
Leukocytes, UA: NEGATIVE
Nitrite, UA: NEGATIVE
RBC UA: NEGATIVE
Urobilinogen, UA: 0.2
pH, UA: 5.5

## 2016-06-24 LAB — CBC WITH DIFFERENTIAL/PLATELET
BASOS PCT: 1.2 % (ref 0.0–3.0)
Basophils Absolute: 0 10*3/uL (ref 0.0–0.1)
EOS PCT: 9 % — AB (ref 0.0–5.0)
Eosinophils Absolute: 0.3 10*3/uL (ref 0.0–0.7)
HCT: 40 % (ref 39.0–52.0)
Hemoglobin: 13.9 g/dL (ref 13.0–17.0)
Lymphocytes Relative: 21.3 % (ref 12.0–46.0)
Lymphs Abs: 0.8 10*3/uL (ref 0.7–4.0)
MCHC: 34.8 g/dL (ref 30.0–36.0)
MCV: 90.7 fl (ref 78.0–100.0)
MONOS PCT: 8.2 % (ref 3.0–12.0)
Monocytes Absolute: 0.3 10*3/uL (ref 0.1–1.0)
Neutro Abs: 2.2 10*3/uL (ref 1.4–7.7)
Neutrophils Relative %: 60.3 % (ref 43.0–77.0)
Platelets: 263 10*3/uL (ref 150.0–400.0)
RBC: 4.41 Mil/uL (ref 4.22–5.81)
RDW: 13.5 % (ref 11.5–15.5)
WBC: 3.7 10*3/uL — AB (ref 4.0–10.5)

## 2016-06-24 LAB — BASIC METABOLIC PANEL
BUN: 15 mg/dL (ref 6–23)
CHLORIDE: 108 meq/L (ref 96–112)
CO2: 28 mEq/L (ref 19–32)
Calcium: 9.3 mg/dL (ref 8.4–10.5)
Creatinine, Ser: 1.18 mg/dL (ref 0.40–1.50)
GFR: 66.28 mL/min (ref >60.00)
Glucose, Bld: 97 mg/dL (ref 70–99)
Potassium: 4 mEq/L (ref 3.5–5.1)
SODIUM: 141 meq/L (ref 135–145)

## 2016-06-24 LAB — HEPATIC FUNCTION PANEL
ALT: 16 U/L (ref 0–53)
AST: 18 U/L (ref 0–37)
Albumin: 4.4 g/dL (ref 3.5–5.2)
Alkaline Phosphatase: 60 U/L (ref 39–117)
Bilirubin, Direct: 0.2 mg/dL (ref 0.0–0.3)
Total Bilirubin: 1.4 mg/dL — ABNORMAL HIGH (ref 0.2–1.2)
Total Protein: 6.7 g/dL (ref 6.0–8.3)

## 2016-06-24 LAB — TSH: TSH: 1.85 u[IU]/mL (ref 0.35–4.50)

## 2016-06-24 LAB — PSA: PSA: 0.72 ng/mL (ref 0.10–4.00)

## 2016-06-26 ENCOUNTER — Encounter: Payer: Self-pay | Admitting: Internal Medicine

## 2016-06-26 ENCOUNTER — Ambulatory Visit (INDEPENDENT_AMBULATORY_CARE_PROVIDER_SITE_OTHER): Payer: BLUE CROSS/BLUE SHIELD | Admitting: Internal Medicine

## 2016-06-26 VITALS — BP 118/80 | HR 62 | Temp 98.4°F | Ht 68.0 in | Wt 162.8 lb

## 2016-06-26 DIAGNOSIS — Z0000 Encounter for general adult medical examination without abnormal findings: Secondary | ICD-10-CM | POA: Diagnosis not present

## 2016-06-26 MED ORDER — SIMVASTATIN 10 MG PO TABS: 10.0000 mg | ORAL_TABLET | Freq: Every day | ORAL | 0 refills | Status: DC

## 2016-06-26 MED ORDER — ALPRAZOLAM 0.5 MG PO TABS: 0.5000 mg | ORAL_TABLET | Freq: Every day | ORAL | 1 refills | Status: DC | PRN

## 2016-06-26 MED ORDER — PAROXETINE HCL ER 25 MG PO TB24: 25.0000 mg | ORAL_TABLET | Freq: Every morning | ORAL | 0 refills | Status: DC

## 2016-06-26 NOTE — Progress Notes (Signed)
Subjective:    Patient ID: Ryan Harris, male    DOB: 04-Oct-1955, 61 y.o.   MRN: [id number]  HPI  61 year old patient who is seen today for a preventive health examination  He has a prior history of a stroke secondary to a PFO with right to left shunting.  This was corrected in 2013.  He has mild dyslipidemia and a history of mild anxiety, depression. He has been on Paxil since probably 2000 due to mild panic disorder   Family history father died at 45 from multiple CVAs and had CAD Mother died at 10 with history of cancer unclear type 2 brothers 2 sisters positive for hypertension and coronary artery disease  Social history married, nonsmoker, works as a Health and safety inspector.  Very active throughout the day, but no regular exercise program   Past Medical History:  Diagnosis Date  . Allergic rhinitis   . Cryptogenic stroke ([place])   . Depression   . Leukopenia    chronic  . Patent foramen ovale    with R-to-L shunting by TEE 02/2011. s/p successful transcatheter closure of PFO with 55mm Gore septal occluder device 07/29/11 (enrolled in [place] [employer] REDUCE trial)     Social History   Social History  . Marital status: Married    Spouse name: N/A  . Number of children: 2  . Years of education: N/A   Occupational History  . Engineer, building services    Social History Main Topics  . Smoking status: Never Smoker  . Smokeless tobacco: Never Used  . Alcohol use 0.6 oz/week    1 Glasses of wine per week     Comment: Rare--beer  . Drug use: No  . Sexual activity: Yes   Other Topics Concern  . Not on file   Social History Narrative   No regular exercise    Past Surgical History:  Procedure Laterality Date  . COLONOSCOPY    . PATENT FORAMEN OVALE CLOSURE  07/29/2011  . PATENT FORAMEN OVALE CLOSURE N/A 07/29/2011   Procedure: PATENT FORAMEN OVALE CLOSURE;  Surgeon: Sherren Mocha, MD;  Location: [facility] CATH LAB;  Service: Cardiovascular;  Laterality: N/A;    Family History  Problem Relation  Age of Onset  . Heart attack Mother     mild  . Stroke Father   . Coronary artery disease Father   . Coronary artery disease Sister     < 60  -- had stent placement at 56  . Hypertension Brother   . Stroke Paternal Uncle     x2  . Aneurysm Paternal Uncle   . Heart attack Paternal Uncle     No Known Allergies  Current Outpatient Prescriptions on File Prior to Visit  Medication Sig Dispense Refill  . ALPRAZolam (XANAX) 0.5 MG tablet Take 1 tablet (0.5 mg total) by mouth daily as needed for anxiety. 90 tablet 1  . aspirin EC 81 MG tablet Take 81 mg by mouth every morning.    Marland Kitchen PARoxetine (PAXIL-CR) 25 MG 24 hr tablet Take 1 tablet (25 mg total) by mouth every morning. 90 tablet 0  . simvastatin (ZOCOR) 10 MG tablet Take 1 tablet (10 mg total) by mouth at bedtime. 90 tablet 0   No current facility-administered medications on file prior to visit.     BP 118/80 (BP Location: Left Arm, Patient Position: Sitting, Cuff Size: Normal)   Pulse 62   Temp 98.4 F (36.9 C) (Oral)   Ht 5\' 8"  (1.727 m)   Wt 162  lb 12.8 oz (73.8 kg)   BMI 24.75 kg/m     Review of Systems  Constitutional: Negative for activity change, appetite change, chills, fatigue and fever.  HENT: Negative for congestion, dental problem, ear pain, hearing loss, mouth sores, rhinorrhea, sinus pressure, sneezing, tinnitus, trouble swallowing and voice change.   Eyes: Negative for photophobia, pain, redness and visual disturbance.  Respiratory: Negative for apnea, cough, choking, chest tightness, shortness of breath and wheezing.   Cardiovascular: Negative for chest pain, palpitations and leg swelling.  Gastrointestinal: Negative for abdominal distention, abdominal pain, anal bleeding, blood in stool, constipation, diarrhea, nausea, rectal pain and vomiting.  Genitourinary: Negative for decreased urine volume, difficulty urinating, discharge, dysuria, flank pain, frequency, genital sores, hematuria, penile swelling, scrotal  swelling, testicular pain and urgency.  Musculoskeletal: Negative for arthralgias, back pain, gait problem, joint swelling, myalgias, neck pain and neck stiffness.  Skin: Negative for color change, rash and wound.  Neurological: Negative for dizziness, tremors, seizures, syncope, facial asymmetry, speech difficulty, weakness, light-headedness, numbness and headaches.  Hematological: Negative for adenopathy. Does not bruise/bleed easily.  Psychiatric/Behavioral: Negative for agitation, behavioral problems, confusion, decreased concentration, dysphoric mood, hallucinations, self-injury, sleep disturbance and suicidal ideas. The patient is not nervous/anxious.        Objective:   Physical Exam  Constitutional: He appears well-developed and well-nourished.  HENT:  Head: Normocephalic and atraumatic.  Right Ear: External ear normal.  Left Ear: External ear normal.  Nose: Nose normal.  Mouth/Throat: Oropharynx is clear and moist.  Eyes: Conjunctivae and EOM are normal. Pupils are equal, round, and reactive to light. No scleral icterus.  Neck: Normal range of motion. Neck supple. No JVD present. No thyromegaly present.  Cardiovascular: Regular rhythm, normal heart sounds and intact distal pulses.  Exam reveals no gallop and no friction rub.   No murmur heard. Pulmonary/Chest: Effort normal and breath sounds normal. He exhibits no tenderness.  Abdominal: Soft. Bowel sounds are normal. He exhibits no distension and no mass. There is no tenderness.  Genitourinary: Prostate normal and penis normal. Rectal exam shows guaiac negative stool.  Musculoskeletal: Normal range of motion. He exhibits no edema or tenderness.  Lymphadenopathy:    He has no cervical adenopathy.  Neurological: He is alert. He has normal reflexes. No cranial nerve deficit. Coordination normal.  Skin: Skin is warm and dry. No rash noted.  Psychiatric: He has a normal mood and affect. His behavior is normal.            Assessment & Plan:  Preventive health examination Status post PFO closure, and history of cardioembolic stroke.  Continue aspirin and low-dose statin therapy.  Patient has been very inconsistent with statin use Panic disorder, stable on present regimen  Follow-up colonoscopy  Nyoka Cowden

## 2016-06-26 NOTE — Progress Notes (Signed)
Pre visit review using our clinic review tool, if applicable. No additional management support is needed unless otherwise documented below in the visit note. 

## 2016-06-26 NOTE — Patient Instructions (Addendum)

## 2016-06-30 ENCOUNTER — Other Ambulatory Visit: Payer: Managed Care, Other (non HMO)

## 2016-06-30 ENCOUNTER — Encounter: Payer: Self-pay | Admitting: Internal Medicine

## 2016-07-07 ENCOUNTER — Encounter: Payer: Managed Care, Other (non HMO) | Admitting: Internal Medicine

## 2016-08-28 ENCOUNTER — Ambulatory Visit (AMBULATORY_SURGERY_CENTER): Payer: Self-pay | Admitting: *Deleted

## 2016-08-28 VITALS — Ht 69.0 in | Wt 161.4 lb

## 2016-08-28 DIAGNOSIS — Z1211 Encounter for screening for malignant neoplasm of colon: Secondary | ICD-10-CM

## 2016-08-28 MED ORDER — BISACODYL 5 MG PO TBEC: 5.0000 mg | DELAYED_RELEASE_TABLET | ORAL | 0 refills | Status: DC

## 2016-08-28 NOTE — Progress Notes (Signed)
Unable to put miralax in as a prep. Patient stated that he would just pay out of poacket since it was cheap.

## 2016-08-28 NOTE — Progress Notes (Signed)
Pt denies allergies to eggs or soy products. Denies difficulty with sedation or anesthesia. Denies any diet or weight loss medications. Denies use of supplemental oxygen.  Emmi instructions given for procedure.  

## 2016-09-02 ENCOUNTER — Encounter: Payer: Self-pay | Admitting: Internal Medicine

## 2016-09-11 ENCOUNTER — Encounter: Payer: Self-pay | Admitting: Internal Medicine

## 2016-09-11 ENCOUNTER — Ambulatory Visit (AMBULATORY_SURGERY_CENTER): Payer: BLUE CROSS/BLUE SHIELD | Admitting: Internal Medicine

## 2016-09-11 VITALS — BP 123/66 | HR 52 | Temp 98.2°F | Resp 13 | Ht 68.0 in | Wt 162.0 lb

## 2016-09-11 DIAGNOSIS — Z1212 Encounter for screening for malignant neoplasm of rectum: Secondary | ICD-10-CM | POA: Diagnosis not present

## 2016-09-11 DIAGNOSIS — D122 Benign neoplasm of ascending colon: Secondary | ICD-10-CM | POA: Diagnosis not present

## 2016-09-11 DIAGNOSIS — Z1211 Encounter for screening for malignant neoplasm of colon: Secondary | ICD-10-CM

## 2016-09-11 MED ORDER — SODIUM CHLORIDE 0.9 % IV SOLN: 500.0000 mL | INTRAVENOUS | Status: DC

## 2016-09-11 NOTE — Op Note (Signed)
[facility] Patient Name: Ryan Harris Procedure Date: 09/11/2016 10:15 AM MRN: [id number] Endoscopist: Gatha Mayer , MD Age: 61 Referring MD:  Date of Birth: 1955-07-18 Gender: Male Account #: [id number] Procedure:                Colonoscopy Indications:              Screening for colorectal malignant neoplasm, Last                            colonoscopy: 2005 Medicines:                Propofol per Anesthesia, Monitored Anesthesia Care Procedure:                Pre-Anesthesia Assessment:                           - Prior to the procedure, a History and Physical                            was performed, and patient medications and                            allergies were reviewed. The patient's tolerance of                            previous anesthesia was also reviewed. The risks                            and benefits of the procedure and the sedation                            options and risks were discussed with the patient.                            All questions were answered, and informed consent                            was obtained. Prior Anticoagulants: The patient                            last took aspirin on the day of the procedure. ASA                            Grade Assessment: II - A patient with mild systemic                            disease. After reviewing the risks and benefits,                            the patient was deemed in satisfactory condition to                            undergo the procedure.  After obtaining informed consent, the colonoscope                            was passed under direct vision. Throughout the                            procedure, the patient's blood pressure, pulse, and                            oxygen saturations were monitored continuously. The                            Colonoscope was introduced through the anus and                            advanced to the the cecum,  identified by                            appendiceal orifice and ileocecal valve. The                            colonoscopy was performed without difficulty. The                            patient tolerated the procedure well. The quality                            of the bowel preparation was good. The ileocecal                            valve, appendiceal orifice, and rectum were                            photographed. The bowel preparation used was                            Miralax. Scope In: 10:26:40 AM Scope Out: 10:42:07 AM Scope Withdrawal Time: 0 hours 11 minutes 28 seconds  Total Procedure Duration: 0 hours 15 minutes 27 seconds  Findings:                 The perianal and digital rectal examinations were                            normal. Pertinent negatives include normal prostate                            (size, shape, and consistency).                           A diminutive polyp was found in the ascending                            colon. The polyp was sessile. The polyp was removed  with a cold snare. Resection and retrieval were                            complete. Verification of patient identification                            for the specimen was done. Estimated blood loss was                            minimal.                           Anal papilla(e) were hypertrophied.                           The exam was otherwise without abnormality on                            direct and retroflexion views. Complications:            No immediate complications. Estimated Blood Loss:     Estimated blood loss was minimal. Impression:               - One diminutive polyp in the ascending colon,                            removed with a cold snare. Resected and retrieved.                           - Anal papilla(e) were hypertrophied.                           - The examination was otherwise normal on direct                            and  retroflexion views. Recommendation:           - Patient has a contact number available for                            emergencies. The signs and symptoms of potential                            delayed complications were discussed with the                            patient. Return to normal activities tomorrow.                            Written discharge instructions were provided to the                            patient.                           - Resume previous diet.                           -  Continue present medications.                           - Repeat colonoscopy is recommended. The                            colonoscopy date will be determined after pathology                            results from today's exam become available for                            review. Gatha Mayer, MD 09/11/2016 10:47:32 AM This report has been signed electronically.

## 2016-09-11 NOTE — Progress Notes (Signed)
Called to room to assist during endoscopic procedure.  Patient ID and intended procedure confirmed with present staff. Received instructions for my participation in the procedure from the performing physician.  

## 2016-09-11 NOTE — Patient Instructions (Addendum)
I found and removed one small polyp that looks benign.  I will let you know pathology results and when to have another routine colonoscopy by mail and/or My Chart.  I appreciate the opportunity to care for you. Gatha Mayer, MD, [facility]  Polyp handout given to patient.  Repeat colonoscopy recommended.  Date to be determined after pathology results reviewed.  YOU HAD AN ENDOSCOPIC PROCEDURE TODAY AT [facility]:   Refer to the procedure report that was given to you for any specific questions about what was found during the examination.  If the procedure report does not answer your questions, please call your gastroenterologist to clarify.  If you requested that your care partner not be given the details of your procedure findings, then the procedure report has been included in a sealed envelope for you to review at your convenience later.  YOU SHOULD EXPECT: Some feelings of bloating in the abdomen. Passage of more gas than usual.  Walking can help get rid of the air that was put into your GI tract during the procedure and reduce the bloating. If you had a lower endoscopy (such as a colonoscopy or flexible sigmoidoscopy) you may notice spotting of blood in your stool or on the toilet paper. If you underwent a bowel prep for your procedure, you may not have a normal bowel movement for a few days.  Please Note:  You might notice some irritation and congestion in your nose or some drainage.  This is from the oxygen used during your procedure.  There is no need for concern and it should clear up in a day or so.  SYMPTOMS TO REPORT IMMEDIATELY:   Following lower endoscopy (colonoscopy or flexible sigmoidoscopy):  Excessive amounts of blood in the stool  Significant tenderness or worsening of abdominal pains  Swelling of the abdomen that is new, acute  Fever of 100F or higher For urgent or emergent issues, a gastroenterologist can be reached at any hour by calling [phone]  [phone].   DIET:  We do recommend a small meal at first, but then you may proceed to your regular diet.  Drink plenty of fluids but you should avoid alcoholic beverages for 24 hours.  ACTIVITY:  You should plan to take it easy for the rest of today and you should NOT DRIVE or use heavy machinery until tomorrow (because of the sedation medicines used during the test).    FOLLOW UP: Our staff will call the number listed on your records the next business day following your procedure to check on you and address any questions or concerns that you may have regarding the information given to you following your procedure. If we do not reach you, we will leave a message.  However, if you are feeling well and you are not experiencing any problems, there is no need to return our call.  We will assume that you have returned to your regular daily activities without incident.  If any biopsies were taken you will be contacted by phone or by letter within the next 1-3 weeks.  Please call us at [phone] if you have not heard about the biopsies in 3 weeks.    SIGNATURES/CONFIDENTIALITY: You and/or your care partner have signed paperwork which will be entered into your electronic medical record.  These signatures attest to the fact that that the information above on your After Visit Summary has been reviewed and is understood.  Full responsibility of the confidentiality of this discharge  information lies with you and/or your care-partner. 

## 2016-09-11 NOTE — Progress Notes (Signed)
Pt's states no medical or surgical changes since previsit or office visit. 

## 2016-09-11 NOTE — Progress Notes (Signed)
Report to PACU, RN, vss, BBS= Clear.  

## 2016-09-14 ENCOUNTER — Telehealth: Payer: Self-pay | Admitting: *Deleted

## 2016-09-14 ENCOUNTER — Telehealth: Payer: Self-pay

## 2016-09-14 NOTE — Telephone Encounter (Signed)
  Follow up Call-  Call back number 09/11/2016  Post procedure Call Back phone  # [phone]  Permission to leave phone message Yes  Some recent data might be hidden     Patient questions:  Do you have a fever, pain , or abdominal swelling? No. Pain Score  0 *  Have you tolerated food without any problems? Yes.    Have you been able to return to your normal activities? Yes.    Do you have any questions about your discharge instructions: Diet   No. Medications  No. Follow up visit  No.  Do you have questions or concerns about your Care? No.  Actions: * If pain score is 4 or above: No action needed, pain <4.

## 2016-09-14 NOTE — Telephone Encounter (Signed)
No answer. Left message that we will attempt to reach him later today to follow up post procedure and for him to call us if he has any questions or concerns.

## 2016-09-17 ENCOUNTER — Encounter: Payer: Self-pay | Admitting: Internal Medicine

## 2016-09-17 DIAGNOSIS — Z86010 Personal history of colonic polyps: Secondary | ICD-10-CM

## 2016-09-17 DIAGNOSIS — Z860101 Personal history of adenomatous and serrated colon polyps: Secondary | ICD-10-CM

## 2016-09-17 HISTORY — DX: Personal history of adenomatous and serrated colon polyps: Z86.0101

## 2016-09-17 HISTORY — DX: Personal history of colonic polyps: Z86.010

## 2016-09-17 NOTE — Progress Notes (Signed)
Diminutive adenoma recall 7 yrs My Chart letter

## 2016-10-02 ENCOUNTER — Other Ambulatory Visit: Payer: Self-pay | Admitting: Internal Medicine

## 2016-10-02 NOTE — Telephone Encounter (Signed)
Pharmacy calling stating that the pt need new Rx for paroxetine and simvastatin to be faxed to [phone](F)

## 2016-10-05 MED ORDER — PAROXETINE HCL ER 25 MG PO TB24: 25.0000 mg | ORAL_TABLET | Freq: Every morning | ORAL | 2 refills | Status: DC

## 2016-10-05 MED ORDER — SIMVASTATIN 10 MG PO TABS: 10.0000 mg | ORAL_TABLET | Freq: Every day | ORAL | 2 refills | Status: DC

## 2016-10-05 NOTE — Telephone Encounter (Signed)
Rxs sent

## 2017-01-28 ENCOUNTER — Ambulatory Visit (INDEPENDENT_AMBULATORY_CARE_PROVIDER_SITE_OTHER): Payer: BLUE CROSS/BLUE SHIELD

## 2017-01-28 DIAGNOSIS — Z23 Encounter for immunization: Secondary | ICD-10-CM

## 2017-01-28 NOTE — Progress Notes (Signed)
Pt received his  1st dose of shingrix this morning, verified with pt that his insurance covers the immunization. Pt was advised to schedule his 2nd injection in 2 to 6 months.

## 2017-02-11 ENCOUNTER — Encounter: Payer: Self-pay | Admitting: Internal Medicine

## 2017-04-13 ENCOUNTER — Ambulatory Visit (INDEPENDENT_AMBULATORY_CARE_PROVIDER_SITE_OTHER): Payer: BLUE CROSS/BLUE SHIELD

## 2017-04-13 DIAGNOSIS — Z23 Encounter for immunization: Secondary | ICD-10-CM

## 2017-06-08 ENCOUNTER — Other Ambulatory Visit: Payer: Self-pay | Admitting: Internal Medicine

## 2017-08-23 ENCOUNTER — Ambulatory Visit (INDEPENDENT_AMBULATORY_CARE_PROVIDER_SITE_OTHER): Payer: BLUE CROSS/BLUE SHIELD | Admitting: Internal Medicine

## 2017-08-23 ENCOUNTER — Encounter: Payer: Self-pay | Admitting: Internal Medicine

## 2017-08-23 VITALS — BP 120/62 | HR 55 | Temp 98.0°F | Ht 69.0 in | Wt 158.0 lb

## 2017-08-23 DIAGNOSIS — Z86010 Personal history of colonic polyps: Secondary | ICD-10-CM | POA: Diagnosis not present

## 2017-08-23 DIAGNOSIS — Z0001 Encounter for general adult medical examination with abnormal findings: Secondary | ICD-10-CM | POA: Diagnosis not present

## 2017-08-23 DIAGNOSIS — F320 Major depressive disorder, single episode, mild: Secondary | ICD-10-CM

## 2017-08-23 DIAGNOSIS — E785 Hyperlipidemia, unspecified: Secondary | ICD-10-CM | POA: Diagnosis not present

## 2017-08-23 DIAGNOSIS — Z0000 Encounter for general adult medical examination without abnormal findings: Secondary | ICD-10-CM

## 2017-08-23 DIAGNOSIS — Z125 Encounter for screening for malignant neoplasm of prostate: Secondary | ICD-10-CM | POA: Diagnosis not present

## 2017-08-23 LAB — COMPREHENSIVE METABOLIC PANEL
ALT: 30 U/L (ref 0–53)
AST: 29 U/L (ref 0–37)
Albumin: 4.1 g/dL (ref 3.5–5.2)
Alkaline Phosphatase: 64 U/L (ref 39–117)
BILIRUBIN TOTAL: 0.7 mg/dL (ref 0.2–1.2)
BUN: 14 mg/dL (ref 6–23)
CO2: 31 meq/L (ref 19–32)
CREATININE: 1.11 mg/dL (ref 0.40–1.50)
Calcium: 9.1 mg/dL (ref 8.4–10.5)
Chloride: 105 mEq/L (ref 96–112)
GFR: 70.86 mL/min (ref >60.00)
GLUCOSE: 97 mg/dL (ref 70–99)
Potassium: 4.1 mEq/L (ref 3.5–5.1)
Sodium: 141 mEq/L (ref 135–145)
TOTAL PROTEIN: 6.5 g/dL (ref 6.0–8.3)

## 2017-08-23 LAB — CBC WITH DIFFERENTIAL/PLATELET
BASOS PCT: 1.7 % (ref 0.0–3.0)
Basophils Absolute: 0.1 10*3/uL (ref 0.0–0.1)
EOS ABS: 0.4 10*3/uL (ref 0.0–0.7)
Eosinophils Relative: 9.9 % — ABNORMAL HIGH (ref 0.0–5.0)
HCT: 39.5 % (ref 39.0–52.0)
Hemoglobin: 13.8 g/dL (ref 13.0–17.0)
LYMPHS ABS: 0.7 10*3/uL (ref 0.7–4.0)
Lymphocytes Relative: 18.3 % (ref 12.0–46.0)
MCHC: 35.1 g/dL (ref 30.0–36.0)
MCV: 89.6 fl (ref 78.0–100.0)
MONO ABS: 0.3 10*3/uL (ref 0.1–1.0)
Monocytes Relative: 7.9 % (ref 3.0–12.0)
NEUTROS ABS: 2.3 10*3/uL (ref 1.4–7.7)
NEUTROS PCT: 62.2 % (ref 43.0–77.0)
PLATELETS: 257 10*3/uL (ref 150.0–400.0)
RBC: 4.4 Mil/uL (ref 4.22–5.81)
RDW: 13.2 % (ref 11.5–15.5)
WBC: 3.7 10*3/uL — ABNORMAL LOW (ref 4.0–10.5)

## 2017-08-23 LAB — TSH: TSH: 2.18 u[IU]/mL (ref 0.35–4.50)

## 2017-08-23 LAB — LIPID PANEL
CHOL/HDL RATIO: 4
Cholesterol: 147 mg/dL (ref 0–200)
HDL: 41.3 mg/dL (ref >39.00)
LDL Cholesterol: 90 mg/dL (ref 0–99)
NONHDL: 106
TRIGLYCERIDES: 78 mg/dL (ref 0.0–149.0)
VLDL: 15.6 mg/dL (ref 0.0–40.0)

## 2017-08-23 LAB — PSA: PSA: 0.62 ng/mL (ref 0.10–4.00)

## 2017-08-23 MED ORDER — PAROXETINE HCL ER 37.5 MG PO TB24: 37.5000 mg | ORAL_TABLET | Freq: Every morning | ORAL | 4 refills | Status: DC

## 2017-08-23 MED ORDER — ALPRAZOLAM 0.5 MG PO TABS: 0.5000 mg | ORAL_TABLET | Freq: Every day | ORAL | 1 refills | Status: DC | PRN

## 2017-08-23 MED ORDER — SIMVASTATIN 10 MG PO TABS: 10.0000 mg | ORAL_TABLET | Freq: Every day | ORAL | 4 refills | Status: DC

## 2017-08-23 NOTE — Patient Instructions (Signed)

## 2017-08-23 NOTE — Progress Notes (Signed)
Subjective:    Patient ID: Ryan Harris, male    DOB: 09-Nov-1955, 62 y.o.   MRN: [id number]  HPI 62 year old patient who is seen today for a preventive health examination. He continues to do remarkably well.  He has a history of a prior stroke secondary to a PFO which was repaired in 2013.  He presented at that time with right hand dyspraxia. He has a history of mild anxiety depression.  He feels he may benefit from a higher dose of paroxetine due to melancholy.  He uses alprazolam rarely when he flies He had a follow-up colonoscopy one year ago revealed a diminutive adenoma.  Follow-up in 7 years suggested  Family history father died at 30 from multiple CVAs and had CAD Mother died at 66 with history of cancer unclear type 2 brothers 2 sisters positive for hypertension and coronary artery disease  Social history married, nonsmoker, works as a Health and safety inspector. Very active throughout the day, but no regular exercise program  Past Medical History:  Diagnosis Date  . Allergic rhinitis   . Allergy    seasonal  . Cryptogenic stroke ([place])   . Depression   . Hx of adenomatous colonic polyps 09/17/2016  . Leukopenia    chronic  . Patent foramen ovale    with R-to-L shunting by TEE 02/2011. s/p successful transcatheter closure of PFO with 17mm Gore septal occluder device 07/29/11 (enrolled in [place] Helix REDUCE trial)     Social History   Socioeconomic History  . Marital status: Married    Spouse name: Not on file  . Number of children: 2  . Years of education: Not on file  . Highest education level: Not on file  Occupational History  . Occupation: Engineer, building services  Social Needs  . Financial resource strain: Not on file  . Food insecurity:    Worry: Not on file    Inability: Not on file  . Transportation needs:    Medical: Not on file    Non-medical: Not on file  Tobacco Use  . Smoking status: Never Smoker  . Smokeless tobacco: Never Used  Substance and Sexual Activity  .  Alcohol use: Yes    Alcohol/week: 0.6 oz    Types: 1 Glasses of wine per week    Comment: Rare--beer  . Drug use: No  . Sexual activity: Yes  Lifestyle  . Physical activity:    Days per week: Not on file    Minutes per session: Not on file  . Stress: Not on file  Relationships  . Social connections:    Talks on phone: Not on file    Gets together: Not on file    Attends religious service: Not on file    Active member of club or organization: Not on file    Attends meetings of clubs or organizations: Not on file    Relationship status: Not on file  . Intimate partner violence:    Fear of current or ex partner: Not on file    Emotionally abused: Not on file    Physically abused: Not on file    Forced sexual activity: Not on file  Other Topics Concern  . Not on file  Social History Narrative   No regular exercise    Past Surgical History:  Procedure Laterality Date  . COLONOSCOPY    . PATENT FORAMEN OVALE CLOSURE  07/29/2011  . PATENT FORAMEN OVALE CLOSURE N/A 07/29/2011   Procedure: PATENT FORAMEN OVALE CLOSURE;  Surgeon: Legrand Como  Burt Knack, MD;  Location: [facility] CATH LAB;  Service: Cardiovascular;  Laterality: N/A;    Family History  Problem Relation Age of Onset  . Heart attack Mother        mild  . Cancer Mother   . Stroke Father   . Coronary artery disease Father   . Heart disease Father   . Coronary artery disease Sister        < 60  -- had stent placement at 57  . Heart disease Sister   . Hypertension Brother   . Stroke Paternal Uncle        x2  . Aneurysm Paternal Uncle   . Heart attack Paternal Uncle   . Colon cancer Neg Hx     No Known Allergies  Current Outpatient Medications on File Prior to Visit  Medication Sig Dispense Refill  . ALPRAZolam (XANAX) 0.5 MG tablet Take 1 tablet (0.5 mg total) by mouth daily as needed for anxiety. 90 tablet 1  . aspirin EC 81 MG tablet Take 81 mg by mouth every morning.    Marland Kitchen PARoxetine (PAXIL-CR) 25 MG 24 hr tablet Take 1  tablet (25 mg total) by mouth every morning. 90 tablet 2  . simvastatin (ZOCOR) 10 MG tablet TAKE 1 TABLET BY MOUTH AT BEDTIME. 90 tablet 0   No current facility-administered medications on file prior to visit.     BP 120/62 (BP Location: Right Arm, Patient Position: Sitting, Cuff Size: Large)   Pulse (!) 55   Temp 98 F (36.7 C) (Oral)   Ht 5\' 9"  (1.753 m)   Wt 158 lb (71.7 kg)   SpO2 100%   BMI 23.33 kg/m    Review of Systems  Constitutional: Negative for appetite change, chills, fatigue and fever.  HENT: Negative for congestion, dental problem, ear pain, hearing loss, sore throat, tinnitus, trouble swallowing and voice change.   Eyes: Negative for pain, discharge and visual disturbance.  Respiratory: Negative for cough, chest tightness, wheezing and stridor.   Cardiovascular: Negative for chest pain, palpitations and leg swelling.  Gastrointestinal: Negative for abdominal distention, abdominal pain, blood in stool, constipation, diarrhea, nausea and vomiting.  Genitourinary: Negative for difficulty urinating, discharge, flank pain, genital sores, hematuria and urgency.  Musculoskeletal: Negative for arthralgias, back pain, gait problem, joint swelling, myalgias and neck stiffness.  Skin: Negative for rash.  Neurological: Negative for dizziness, syncope, speech difficulty, weakness, numbness and headaches.  Hematological: Negative for adenopathy. Does not bruise/bleed easily.  Psychiatric/Behavioral: Positive for dysphoric mood. Negative for behavioral problems. The patient is not nervous/anxious.        Objective:   Physical Exam  Constitutional: He appears well-developed and well-nourished.  HENT:  Head: Normocephalic and atraumatic.  Right Ear: External ear normal.  Left Ear: External ear normal.  Nose: Nose normal.  Mouth/Throat: Oropharynx is clear and moist.  Eyes: Pupils are equal, round, and reactive to light. Conjunctivae and EOM are normal. No scleral icterus.    Neck: Normal range of motion. Neck supple. No JVD present. No thyromegaly present.  Cardiovascular: Regular rhythm, normal heart sounds and intact distal pulses. Exam reveals no gallop and no friction rub.  No murmur heard. Pulmonary/Chest: Effort normal and breath sounds normal. He exhibits no tenderness.  Abdominal: Soft. Bowel sounds are normal. He exhibits no distension and no mass. There is no tenderness.  Genitourinary: Prostate normal and penis normal.  Musculoskeletal: Normal range of motion. He exhibits no edema or tenderness.  Lymphadenopathy:    He  has no cervical adenopathy.  Neurological: He is alert. He has normal reflexes. No cranial nerve deficit. Coordination normal.  Skin: Skin is warm and dry. No rash noted.  Psychiatric: He has a normal mood and affect. His behavior is normal.          Assessment & Plan:   Preventive health examination Status post PFO repair History of mild dyslipidemia.  Continue low intensity statin therapy History mild depression.  Will increase paroxetine slightly History of colonic polyps.  Follow-up colonoscopy 6 years  Review updated lab Follow-up 1 year or as needed  Nyoka Cowden

## 2017-08-24 LAB — HEPATITIS C ANTIBODY
Hepatitis C Ab: NONREACTIVE
SIGNAL TO CUT-OFF: 0.02 (ref <1.00)

## 2017-08-29 ENCOUNTER — Other Ambulatory Visit: Payer: Self-pay | Admitting: Internal Medicine

## 2017-08-31 ENCOUNTER — Telehealth: Payer: Self-pay | Admitting: Internal Medicine

## 2017-08-31 NOTE — Telephone Encounter (Signed)
Copied from [place] [id number]. Topic: Quick Communication - Rx Refill/Question >> Aug 31, 2017  4:20 PM Cleaster Corin, [place] wrote: Medication:ALPRAZolam Duanne Moron) 0.5 MG tablet [[id number]]  PARoxetine (PAXIL-CR) 37.5 MG 24 hr tablet [[id number]] Has the patient contacted their pharmacy?  (Agent: If no, request that the patient contact the pharmacy for the refill.) Preferred Pharmacy (with phone number or street name): [place], [place] [address] [place] [address] Phone: [phone] Fax: [phone]   Agent: Please be advised that RX refills may take up to 3 business days. We ask that you follow-up with your pharmacy.

## 2017-09-01 ENCOUNTER — Other Ambulatory Visit: Payer: Self-pay

## 2017-09-01 MED ORDER — PAROXETINE HCL ER 37.5 MG PO TB24: 37.5000 mg | ORAL_TABLET | Freq: Every morning | ORAL | 4 refills | Status: DC

## 2017-09-01 MED ORDER — ALPRAZOLAM 0.5 MG PO TABS: 0.5000 mg | ORAL_TABLET | Freq: Every day | ORAL | 1 refills | Status: DC | PRN

## 2017-09-01 NOTE — Telephone Encounter (Signed)
Request for xanax. Last ordered on 08/23/17 Request for paroxetine. Last ordered on 08/23/17.   Requesting these prescriptions to go [facility]  Provider  Dr. Burnice Logan [place]  08/23/17

## 2017-09-01 NOTE — Telephone Encounter (Signed)
° ° °  This med was also sent to the wrong pharmacy   Need to be resent to [facility]  simvastatin (ZOCOR) 10 MG tablet

## 2017-09-01 NOTE — Telephone Encounter (Signed)
Patient medication was sent to the wrong pharmacy. Medication sent to correct pharmacy. Wrong pharmacy notified. Patient aware. No further action needed.

## 2017-09-07 MED ORDER — SIMVASTATIN 10 MG PO TABS: 10.0000 mg | ORAL_TABLET | Freq: Every day | ORAL | 4 refills | Status: DC

## 2017-09-07 NOTE — Telephone Encounter (Signed)
Caller name: Varnell,Stacey Relation to pt: spouse Call back number: [phone] Pharmacy: [place], [place] [phone] (Phone) [phone] (Fax)    Reason for call:  Patient spoke with [facility] today and they denied receiving simvastatin (ZOCOR) 10 MG tablet, please advise regarding the status.

## 2017-09-07 NOTE — Addendum Note (Signed)
Addended by: Corky Sox E on: 09/07/2017 11:41 AM   Modules accepted: Orders

## 2017-09-07 NOTE — Telephone Encounter (Signed)
Called Kristopher Oppenheim and order was canceled. Resending to [facility]

## 2017-12-10 DIAGNOSIS — L814 Other melanin hyperpigmentation: Secondary | ICD-10-CM | POA: Diagnosis not present

## 2017-12-10 DIAGNOSIS — L812 Freckles: Secondary | ICD-10-CM | POA: Diagnosis not present

## 2017-12-10 DIAGNOSIS — L821 Other seborrheic keratosis: Secondary | ICD-10-CM | POA: Diagnosis not present

## 2017-12-10 DIAGNOSIS — D229 Melanocytic nevi, unspecified: Secondary | ICD-10-CM | POA: Diagnosis not present

## 2017-12-10 DIAGNOSIS — L570 Actinic keratosis: Secondary | ICD-10-CM | POA: Diagnosis not present

## 2018-02-25 ENCOUNTER — Telehealth: Payer: Self-pay | Admitting: Family Medicine

## 2018-02-25 NOTE — Telephone Encounter (Signed)
Copied from [place] [id number]. Topic: Appointment Scheduling - Scheduling Inquiry for Clinic >> Feb 25, 2018  4:18 PM Margot Ables wrote: Reason for CRM: The patient would like to request to ask Dr. Elease Hashimoto if he would take him on as a transfer patient from Dr. Raliegh Ip. Mrs. Jarrod Mcenery, wife, greatly appreciates him accepting her as a pt and her and her husband would like to have the same provider. Please advise.

## 2018-02-28 NOTE — Telephone Encounter (Signed)
Please see messages Thank you °

## 2018-02-28 NOTE — Telephone Encounter (Signed)
OK 

## 2018-02-28 NOTE — Telephone Encounter (Signed)
Please advise 

## 2018-04-12 DIAGNOSIS — L570 Actinic keratosis: Secondary | ICD-10-CM | POA: Diagnosis not present

## 2018-04-20 ENCOUNTER — Ambulatory Visit (INDEPENDENT_AMBULATORY_CARE_PROVIDER_SITE_OTHER): Payer: BLUE CROSS/BLUE SHIELD | Admitting: Family Medicine

## 2018-04-20 ENCOUNTER — Encounter: Payer: Self-pay | Admitting: Family Medicine

## 2018-04-20 VITALS — BP 98/70 | HR 63 | Temp 98.2°F | Ht 69.0 in | Wt 160.7 lb

## 2018-04-20 DIAGNOSIS — F339 Major depressive disorder, recurrent, unspecified: Secondary | ICD-10-CM

## 2018-04-20 DIAGNOSIS — M25511 Pain in right shoulder: Secondary | ICD-10-CM | POA: Diagnosis not present

## 2018-04-20 DIAGNOSIS — Z23 Encounter for immunization: Secondary | ICD-10-CM

## 2018-04-20 DIAGNOSIS — Z8673 Personal history of transient ischemic attack (TIA), and cerebral infarction without residual deficits: Secondary | ICD-10-CM | POA: Insufficient documentation

## 2018-04-20 DIAGNOSIS — E785 Hyperlipidemia, unspecified: Secondary | ICD-10-CM

## 2018-04-20 NOTE — Patient Instructions (Signed)
Set up physical for April or May.

## 2018-04-20 NOTE — Progress Notes (Signed)
Subjective:     Patient ID: Ryan Harris, male   DOB: 1955-06-05, 62 y.o.   MRN: [id number]  HPI Patient seen to establish care.  He had CVA around age 60 and diagnosis at that time of patent foramen ovale which was subsequently repaired.  He has had no neurologic issues since then.  Takes baby aspirin daily along with Zocor 10 mg daily.  He usually gets physicals in the spring.  He has been on Paxil for history of depression and anxiety and that seems to be working well.  No flu vaccine yet.  He is willing to consider getting this.  Patient denies any smoking history.  No regular alcohol use.  He works for a Physicist, medical down in [employer]  Recent right shoulder pains.  Worse at night.  No injury.  No neck pain.  Worse with abduction.  Pain is relatively mild at this time.  Past Medical History:  Diagnosis Date  . Allergic rhinitis   . Allergy    seasonal  . Cryptogenic stroke ([place])   . Depression   . Hx of adenomatous colonic polyps 09/17/2016  . Leukopenia    chronic  . Patent foramen ovale    with R-to-L shunting by TEE 02/2011. s/p successful transcatheter closure of PFO with 37mm Gore septal occluder device 07/29/11 (enrolled in [place] Helix REDUCE trial)   Past Surgical History:  Procedure Laterality Date  . COLONOSCOPY    . PATENT FORAMEN OVALE CLOSURE  07/29/2011  . PATENT FORAMEN OVALE CLOSURE N/A 07/29/2011   Procedure: PATENT FORAMEN OVALE CLOSURE;  Surgeon: Sherren Mocha, MD;  Location: [facility] CATH LAB;  Service: Cardiovascular;  Laterality: N/A;    reports that he has never smoked. He has never used smokeless tobacco. He reports that he drinks about 1.0 standard drinks of alcohol per week. He reports that he does not use drugs. family history includes Aneurysm in his paternal uncle; Cancer in his mother; Coronary artery disease in his father and sister; Heart attack in his mother and paternal uncle; Heart disease in his father and sister; Hypertension in his  brother; Stroke in his father and paternal uncle. No Known Allergies   Review of Systems  Constitutional: Negative for fatigue and unexpected weight change.  Eyes: Negative for visual disturbance.  Respiratory: Negative for cough, chest tightness and shortness of breath.   Cardiovascular: Negative for chest pain, palpitations and leg swelling.  Neurological: Negative for dizziness, syncope, weakness, light-headedness and headaches.       Objective:   Physical Exam  Constitutional: He is oriented to person, place, and time. He appears well-developed and well-nourished.  HENT:  Right Ear: External ear normal.  Left Ear: External ear normal.  Mouth/Throat: Oropharynx is clear and moist.  Eyes: Pupils are equal, round, and reactive to light.  Neck: Neck supple. No thyromegaly present.  Cardiovascular: Normal rate and regular rhythm.  Pulmonary/Chest: Effort normal and breath sounds normal. No respiratory distress. He has no wheezes. He has no rales.  Musculoskeletal: He exhibits no edema.  Right shoulder no localized tenderness.  Full range of motion.  Minimal pain with abduction against resistance  Neurological: He is alert and oriented to person, place, and time.       Assessment:     #1 history of CVA.  Past history of patent foramen ovale which was repaired  #2 history of mild hyperlipidemia  #3 history of recurrent depression and anxiety stable on Paxil  #4 right shoulder pain.  Suspect rotator cuff tendinitis.  Good range of motion.    Plan:     -Recommend flu vaccine -Patient will set up physical for sometime in the spring -Maintain good range of motion right shoulder.  Consider steroid injection if not improving over the next few weeks  Eulas Post MD [facility]

## 2018-06-02 DIAGNOSIS — H40013 Open angle with borderline findings, low risk, bilateral: Secondary | ICD-10-CM | POA: Diagnosis not present

## 2018-09-29 ENCOUNTER — Telehealth: Payer: Self-pay | Admitting: Family Medicine

## 2018-09-29 NOTE — Telephone Encounter (Signed)
Copied from [place] [id number]. Topic: Quick Communication - Rx Refill/Question >> Sep 29, 2018 12:09 PM Margot Ables wrote: Medication: simvastatin (ZOCOR) 10 MG tablet - last filled 07/05/2018 - RX is expired - requesting 90 day supply RX  Has the patient contacted their pharmacy? Yes - pharmacy calling Preferred Pharmacy (with phone number or street name): [facility] - [place], [place] [phone] (Phone) [phone] (Fax)

## 2018-09-30 ENCOUNTER — Other Ambulatory Visit: Payer: Self-pay

## 2018-09-30 MED ORDER — SIMVASTATIN 10 MG PO TABS: 10.0000 mg | ORAL_TABLET | Freq: Every day | ORAL | 0 refills | Status: DC

## 2018-09-30 NOTE — Telephone Encounter (Signed)
90 day refill has been sent to the pharmacy with a message that patient needs a follow up appointment for further refills.

## 2018-12-27 DIAGNOSIS — L218 Other seborrheic dermatitis: Secondary | ICD-10-CM | POA: Diagnosis not present

## 2018-12-27 DIAGNOSIS — L738 Other specified follicular disorders: Secondary | ICD-10-CM | POA: Diagnosis not present

## 2019-01-23 ENCOUNTER — Other Ambulatory Visit: Payer: Self-pay | Admitting: Family Medicine

## 2019-01-23 NOTE — Telephone Encounter (Signed)
See request °

## 2019-01-23 NOTE — Telephone Encounter (Signed)
Requested medication (s) are due for refill today: yes  Requested medication (s) are on the active medication list: yes  Last refill:   Future visit scheduled:yes  Notes to clinic: ordering provider and pcp are different   Requested Prescriptions  Pending Prescriptions Disp Refills   PARoxetine (PAXIL-CR) 37.5 MG 24 hr tablet 90 tablet 4    Sig: Take 1 tablet (37.5 mg total) by mouth every morning.     Psychiatry:  Antidepressants - SSRI Failed - 01/23/2019 10:23 AM      Failed - Valid encounter within last 6 months    Recent Outpatient Visits          9 months ago History of cardioembolic cerebrovascular accident (CVA)   Therapist, music at [facility], Alinda Sierras, MD   1 year ago Encounter for preventive health examination   Therapist, music at [facility], Doretha Sou, MD   2 years ago Encounter for preventive health examination   Therapist, music at [facility], Doretha Sou, MD   4 years ago PFO (patent foramen ovale)   Therapist, music at [facility], Doretha Sou, MD   5 years ago Preventative health care   [facility] at [place], [place], [place]      Future Appointments            In 1 month Burchette, Alinda Sierras, MD [place] at [place], [place] - Completed PHQ-2 or PHQ-9 in the last 360 days.

## 2019-01-23 NOTE — Telephone Encounter (Addendum)
Patient has an appointment on 03/10/2019 but he will run out of his PARoxetine (PAXIL-CR) 37.5 MG 24 hr tablet medication.  Please send a supply to take him until his appt to his preferred pharmacy for this [place] [employer].

## 2019-01-24 MED ORDER — PAROXETINE HCL ER 37.5 MG PO TB24: 37.5000 mg | ORAL_TABLET | Freq: Every morning | ORAL | 0 refills | Status: DC

## 2019-01-25 ENCOUNTER — Other Ambulatory Visit: Payer: Self-pay

## 2019-01-25 ENCOUNTER — Ambulatory Visit: Payer: Self-pay | Admitting: *Deleted

## 2019-01-25 MED ORDER — PAROXETINE HCL ER 37.5 MG PO TB24: 37.5000 mg | ORAL_TABLET | Freq: Every morning | ORAL | 0 refills | Status: DC

## 2019-01-25 NOTE — Telephone Encounter (Signed)
Ryan Harris, wife called in.  Shahmeer's Paroxetine 37.5 mg 24 hr tablets were were sent to the wrong pharmacy.   They were sent to the [facility].    It needs to be sent to [facility]. She was concerned about him running out before his appt.  [facility] sends his medications out fast.  She is requesting a call from [facility] letting her know it got straightened out.   Her number is [phone]  I sent this message to Dr. Erick Blinks office for [facility].

## 2019-01-25 NOTE — Telephone Encounter (Signed)
Called Ryan Harris and apologized for the mistake and let her know that I just sent this refill to the [facility] for the patient. Wife verbalized an understanding.

## 2019-03-10 ENCOUNTER — Ambulatory Visit (INDEPENDENT_AMBULATORY_CARE_PROVIDER_SITE_OTHER): Payer: BLUE CROSS/BLUE SHIELD | Admitting: Family Medicine

## 2019-03-10 ENCOUNTER — Other Ambulatory Visit: Payer: Self-pay

## 2019-03-10 ENCOUNTER — Encounter: Payer: Self-pay | Admitting: Family Medicine

## 2019-03-10 VITALS — BP 110/72 | HR 58 | Temp 98.6°F | Ht 68.0 in | Wt 157.5 lb

## 2019-03-10 DIAGNOSIS — Z23 Encounter for immunization: Secondary | ICD-10-CM | POA: Diagnosis not present

## 2019-03-10 DIAGNOSIS — Z0000 Encounter for general adult medical examination without abnormal findings: Secondary | ICD-10-CM | POA: Diagnosis not present

## 2019-03-10 LAB — CBC WITH DIFFERENTIAL/PLATELET
Basophils Absolute: 0 10*3/uL (ref 0.0–0.1)
Basophils Relative: 1.2 % (ref 0.0–3.0)
Eosinophils Absolute: 0.2 10*3/uL (ref 0.0–0.7)
Eosinophils Relative: 4.9 % (ref 0.0–5.0)
HCT: 38.6 % — ABNORMAL LOW (ref 39.0–52.0)
Hemoglobin: 13.3 g/dL (ref 13.0–17.0)
Lymphocytes Relative: 19.8 % (ref 12.0–46.0)
Lymphs Abs: 0.8 10*3/uL (ref 0.7–4.0)
MCHC: 34.5 g/dL (ref 30.0–36.0)
MCV: 91.8 fl (ref 78.0–100.0)
Monocytes Absolute: 0.4 10*3/uL (ref 0.1–1.0)
Monocytes Relative: 8.8 % (ref 3.0–12.0)
Neutro Abs: 2.6 10*3/uL (ref 1.4–7.7)
Neutrophils Relative %: 65.3 % (ref 43.0–77.0)
Platelets: 278 10*3/uL (ref 150.0–400.0)
RBC: 4.2 Mil/uL — ABNORMAL LOW (ref 4.22–5.81)
RDW: 12.8 % (ref 11.5–15.5)
WBC: 4 10*3/uL (ref 4.0–10.5)

## 2019-03-10 LAB — HEPATIC FUNCTION PANEL
ALT: 14 U/L (ref 0–53)
AST: 15 U/L (ref 0–37)
Albumin: 4.5 g/dL (ref 3.5–5.2)
Alkaline Phosphatase: 67 U/L (ref 39–117)
Bilirubin, Direct: 0.2 mg/dL (ref 0.0–0.3)
Total Bilirubin: 1.1 mg/dL (ref 0.2–1.2)
Total Protein: 6.7 g/dL (ref 6.0–8.3)

## 2019-03-10 LAB — BASIC METABOLIC PANEL
BUN: 16 mg/dL (ref 6–23)
CO2: 30 mEq/L (ref 19–32)
Calcium: 9.3 mg/dL (ref 8.4–10.5)
Chloride: 105 mEq/L (ref 96–112)
Creatinine, Ser: 1.25 mg/dL (ref 0.40–1.50)
GFR: 57.85 mL/min — ABNORMAL LOW (ref >60.00)
Glucose, Bld: 85 mg/dL (ref 70–99)
Potassium: 4.6 mEq/L (ref 3.5–5.1)
Sodium: 142 mEq/L (ref 135–145)

## 2019-03-10 LAB — LIPID PANEL
Cholesterol: 153 mg/dL (ref 0–200)
HDL: 51.8 mg/dL (ref >39.00)
LDL Cholesterol: 92 mg/dL (ref 0–99)
NonHDL: 100.95
Total CHOL/HDL Ratio: 3
Triglycerides: 45 mg/dL (ref 0.0–149.0)
VLDL: 9 mg/dL (ref 0.0–40.0)

## 2019-03-10 LAB — PSA: PSA: 0.68 ng/mL (ref 0.10–4.00)

## 2019-03-10 LAB — TSH: TSH: 1.22 u[IU]/mL (ref 0.35–4.50)

## 2019-03-10 MED ORDER — SIMVASTATIN 10 MG PO TABS: 10.0000 mg | ORAL_TABLET | Freq: Every day | ORAL | 3 refills | Status: DC

## 2019-03-10 MED ORDER — PAROXETINE HCL ER 37.5 MG PO TB24: 37.5000 mg | ORAL_TABLET | Freq: Every morning | ORAL | 3 refills | Status: DC

## 2019-03-10 NOTE — Addendum Note (Signed)
Addended by: Alverda Skeans on: 03/10/2019 11:44 AM   Modules accepted: Orders

## 2019-03-10 NOTE — Progress Notes (Signed)
Subjective:     Patient ID: Ryan Harris, male   DOB: 1956/03/15, 63 y.o.   MRN: [id number]  HPI Mr. Bennight is seen for physical exam.  He had CVA back around age 42 which was related to embolic from PFO.  His other medical problems include history of recurrent depression and hyperlipidemia.  He is on simvastatin and takes controlled-release Paxil.  Requesting refills.  Still takes baby aspirin 1 daily.  Needs flu vaccine.  Needs Pneumovax.  Shingles vaccine up-to-date.  Colonoscopy up-to-date.  He has never smoked.  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  Wife is a retired Marine scientist.  Past Medical History:  Diagnosis Date  . Allergic rhinitis   . Allergy    seasonal  . Cryptogenic stroke ([place])   . Depression   . Hx of adenomatous colonic polyps 09/17/2016  . Leukopenia    chronic  . Patent foramen ovale    with R-to-L shunting by TEE 02/2011. s/p successful transcatheter closure of PFO with 37mm Gore septal occluder device 07/29/11 (enrolled in [place] Helix REDUCE trial)   Past Surgical History:  Procedure Laterality Date  . COLONOSCOPY    . PATENT FORAMEN OVALE CLOSURE  07/29/2011  . PATENT FORAMEN OVALE CLOSURE N/A 07/29/2011   Procedure: PATENT FORAMEN OVALE CLOSURE;  Surgeon: Sherren Mocha, MD;  Location: [facility] CATH LAB;  Service: Cardiovascular;  Laterality: N/A;    reports that he has never smoked. He has never used smokeless tobacco. He reports current alcohol use of about 1.0 standard drinks of alcohol per week. He reports that he does not use drugs. family history includes Aneurysm in his paternal uncle; Cancer in his mother; Coronary artery disease in his father and sister; Heart attack in his mother and paternal uncle; Heart disease in his father and sister; Hypertension in his brother; Stroke in his father and paternal uncle. No Known Allergies   Review of Systems  Constitutional: Negative for activity change, appetite change, fatigue and fever.  HENT: Negative for congestion,  ear pain and trouble swallowing.   Eyes: Negative for pain and visual disturbance.  Respiratory: Negative for cough, shortness of breath and wheezing.   Cardiovascular: Negative for chest pain and palpitations.  Gastrointestinal: Negative for abdominal distention, abdominal pain, blood in stool, constipation, diarrhea, nausea, rectal pain and vomiting.  Endocrine: Negative for polydipsia and polyuria.  Genitourinary: Negative for dysuria, hematuria and testicular pain.  Musculoskeletal: Negative for arthralgias and joint swelling.  Skin: Negative for rash.  Neurological: Negative for dizziness, syncope and headaches.  Hematological: Negative for adenopathy.  Psychiatric/Behavioral: Negative for confusion and dysphoric mood.       Objective:   Physical Exam Constitutional:      General: He is not in acute distress.    Appearance: He is well-developed.  HENT:     Head: Normocephalic and atraumatic.     Right Ear: External ear normal.     Left Ear: External ear normal.  Eyes:     Conjunctiva/sclera: Conjunctivae normal.     Pupils: Pupils are equal, round, and reactive to light.  Neck:     Musculoskeletal: Normal range of motion and neck supple.     Thyroid: No thyromegaly.  Cardiovascular:     Rate and Rhythm: Normal rate and regular rhythm.     Heart sounds: Normal heart sounds. No murmur.  Pulmonary:     Effort: No respiratory distress.     Breath sounds: No wheezing or rales.  Abdominal:  General: Bowel sounds are normal. There is no distension.     Palpations: Abdomen is soft. There is no mass.     Tenderness: There is no abdominal tenderness. There is no guarding or rebound.  Lymphadenopathy:     Cervical: No cervical adenopathy.  Skin:    Findings: No rash.  Neurological:     Mental Status: He is alert and oriented to person, place, and time.     Cranial Nerves: No cranial nerve deficit.        Assessment:     Physical exam.  He has medical problems as  above.  Past history of cardioembolic CVA related to PFO.  He had patch procedure for repair.  We discussed the following health maintenance and medical issues    Plan:     -Flu vaccine given and also recommend Pneumovax -Refilled medications for 1 year -Obtain follow-up labs including PSA -Recommend 1 year follow-up for physical and sooner as needed  Eulas Post MD [facility] at [facility]

## 2019-03-10 NOTE — Patient Instructions (Signed)
Preventive Care 65 Years and Older, Male Preventive care refers to lifestyle choices and visits with your health care provider that can promote health and wellness. This includes:  A yearly physical exam. This is also called an annual well check.  Regular dental and eye exams.  Immunizations.  Screening for certain conditions.  Healthy lifestyle choices, such as diet and exercise. What can I expect for my preventive care visit? Physical exam Your health care provider will check:  Height and weight. These may be used to calculate body mass index (BMI), which is a measurement that tells if you are at a healthy weight.  Heart rate and blood pressure.  Your skin for abnormal spots. Counseling Your health care provider may ask you questions about:  Alcohol, tobacco, and drug use.  Emotional well-being.  Home and relationship well-being.  Sexual activity.  Eating habits.  History of falls.  Memory and ability to understand (cognition).  Work and work Statistician. What immunizations do I need?  Influenza (flu) vaccine  This is recommended every year. Tetanus, diphtheria, and pertussis (Tdap) vaccine  You may need a Td booster every 10 years. Varicella (chickenpox) vaccine  You may need this vaccine if you have not already been vaccinated. Zoster (shingles) vaccine  You may need this after age 50. Pneumococcal conjugate (PCV13) vaccine  One dose is recommended after age 65. Pneumococcal polysaccharide (PPSV23) vaccine  One dose is recommended after age 65. Measles, mumps, and rubella (MMR) vaccine  You may need at least one dose of MMR if you were born in 1957 or later. You may also need a second dose. Meningococcal conjugate (MenACWY) vaccine  You may need this if you have certain conditions. Hepatitis A vaccine  You may need this if you have certain conditions or if you travel or work in places where you may be exposed to hepatitis A. Hepatitis B vaccine   You may need this if you have certain conditions or if you travel or work in places where you may be exposed to hepatitis B. Haemophilus influenzae type b (Hib) vaccine  You may need this if you have certain conditions. You may receive vaccines as individual doses or as more than one vaccine together in one shot (combination vaccines). Talk with your health care provider about the risks and benefits of combination vaccines. What tests do I need? Blood tests  Lipid and cholesterol levels. These may be checked every 5 years, or more frequently depending on your overall health.  Hepatitis C test.  Hepatitis B test. Screening  Lung cancer screening. You may have this screening every year starting at age 65 if you have a 30-pack-year history of smoking and currently smoke or have quit within the past 15 years.  Colorectal cancer screening. All adults should have this screening starting at age 65 and continuing until age 54. Your health care provider may recommend screening at age 65 if you are at increased risk. You will have tests every 1-10 years, depending on your results and the type of screening test.  Prostate cancer screening. Recommendations will vary depending on your family history and other risks.  Diabetes screening. This is done by checking your blood sugar (glucose) after you have not eaten for a while (fasting). You may have this done every 1-3 years.  Abdominal aortic aneurysm (AAA) screening. You may need this if you are a current or former smoker.  Sexually transmitted disease (STD) testing. Follow these instructions at home: Eating and drinking  Eat  a diet that includes fresh fruits and vegetables, whole grains, lean protein, and low-fat dairy products. Limit your intake of foods with high amounts of sugar, saturated fats, and salt.  Take vitamin and mineral supplements as recommended by your health care provider.  Do not drink alcohol if your health care provider  tells you not to drink.  If you drink alcohol: ? Limit how much you have to 0-2 drinks a day. ? Be aware of how much alcohol is in your drink. In the [place] one drink equals one 12 oz bottle of beer (355 mL), one 5 oz glass of wine (148 mL), or one 1 oz glass of hard liquor (44 mL). Lifestyle  Take daily care of your teeth and gums.  Stay active. Exercise for at least 30 minutes on 5 or more days each week.  Do not use any products that contain nicotine or tobacco, such as cigarettes, e-cigarettes, and chewing tobacco. If you need help quitting, ask your health care provider.  If you are sexually active, practice safe sex. Use a condom or other form of protection to prevent STIs (sexually transmitted infections).  Talk with your health care provider about taking a low-dose aspirin or statin. What's next?  Visit your health care provider once a year for a well check visit.  Ask your health care provider how often you should have your eyes and teeth checked.  Stay up to date on all vaccines. This information is not intended to replace advice given to you by your health care provider. Make sure you discuss any questions you have with your health care provider. Document Released: 06/07/2015 Document Revised: 05/05/2018 Document Reviewed: 05/05/2018 Elsevier Patient Education  2020 Elsevier Inc.  

## 2019-03-12 ENCOUNTER — Encounter: Payer: Self-pay | Admitting: Family Medicine

## 2019-03-13 ENCOUNTER — Other Ambulatory Visit: Payer: Self-pay | Admitting: Family Medicine

## 2019-03-13 NOTE — Telephone Encounter (Signed)
Requested medication (s) are due for refill today: yes  Requested medication (s) are on the active medication list: yes  Last refill:  08/23/2017  Future visit scheduled: no  Notes to clinic:  Refill cannot be delegated    Requested Prescriptions  Pending Prescriptions Disp Refills   ALPRAZolam (XANAX) 0.5 MG tablet 90 tablet 1    Sig: Take 1 tablet (0.5 mg total) by mouth daily as needed for anxiety.     Not Delegated - Psychiatry:  Anxiolytics/Hypnotics Failed - 03/13/2019  9:35 AM      Failed - This refill cannot be delegated      Failed - Urine Drug Screen completed in last 360 days.      Passed - Valid encounter within last 6 months    Recent Outpatient Visits          3 days ago Physical exam   Therapist, [facility] at [facility], MD   10 months ago History of cardioembolic cerebrovascular accident (CVA)   Therapist, [facility] at [facility], Alinda Sierras, MD   1 year ago Encounter for preventive health examination   Therapist, music at [facility], Doretha Sou, MD   2 years ago Encounter for preventive health examination   [employer] [facility], Doretha Sou, MD   4 years ago PFO (patent foramen ovale)   Therapist, music at [facility], Doretha Sou, MD

## 2019-03-13 NOTE — Telephone Encounter (Signed)
Medication Refill - Medication: ALPRAZolam (XANAX) 0.5 MG tablet    Has the patient contacted their pharmacy? Yes.   (Agent: If no, request that the patient contact the pharmacy for the refill.) (Agent: If yes, when and what did the pharmacy advise?)  Preferred Pharmacy (with phone number or street name):  [id number] - [place], [address]  [place] [address]  Phone: [phone] Fax: [phone]  Not a 24 hour pharmacy; exact hours not known     Agent: Please be advised that RX refills may take up to 3 business days. We ask that you follow-up with your pharmacy.

## 2019-03-17 MED ORDER — ALPRAZOLAM 0.5 MG PO TABS: 0.5000 mg | ORAL_TABLET | Freq: Every day | ORAL | 1 refills | Status: DC | PRN

## 2019-03-17 NOTE — Telephone Encounter (Signed)
Patient wife is calling to check on the status of xanax.  Cb- [phone]  Dr. Raliegh Ip was the original prescribing doctor. The patient has transferred his care to Dr. Elease Hashimoto.  Please advise Thank you  [facility] [place]

## 2019-05-15 ENCOUNTER — Other Ambulatory Visit: Payer: Self-pay | Admitting: Family Medicine

## 2019-05-15 MED ORDER — PAROXETINE HCL ER 37.5 MG PO TB24: 37.5000 mg | ORAL_TABLET | Freq: Every morning | ORAL | 3 refills | Status: DC

## 2019-05-15 NOTE — Telephone Encounter (Signed)
Pt's wife Marzetta Board ask that prescription for PARoxetine (PAXIL-CR) 37.5 MG 24 hr tablet be sent back to  [place], [place] Phone:  [phone]  Fax:  [phone]      They are saying that they never got it.Pt only has 15 left

## 2019-06-12 DIAGNOSIS — W268XXA Contact with other sharp object(s), not elsewhere classified, initial encounter: Secondary | ICD-10-CM | POA: Diagnosis not present

## 2019-06-12 DIAGNOSIS — S61412A Laceration without foreign body of left hand, initial encounter: Secondary | ICD-10-CM | POA: Diagnosis not present

## 2019-11-28 DIAGNOSIS — K219 Gastro-esophageal reflux disease without esophagitis: Secondary | ICD-10-CM | POA: Diagnosis not present

## 2019-11-28 DIAGNOSIS — J342 Deviated nasal septum: Secondary | ICD-10-CM | POA: Diagnosis not present

## 2020-02-07 DIAGNOSIS — H40013 Open angle with borderline findings, low risk, bilateral: Secondary | ICD-10-CM | POA: Diagnosis not present

## 2020-05-05 ENCOUNTER — Other Ambulatory Visit: Payer: Self-pay | Admitting: Family Medicine

## 2020-05-13 ENCOUNTER — Telehealth: Payer: Self-pay | Admitting: Family Medicine

## 2020-05-13 MED ORDER — PAROXETINE HCL ER 37.5 MG PO TB24: ORAL_TABLET | ORAL | 3 refills | Status: DC

## 2020-05-13 NOTE — Telephone Encounter (Signed)
Pts spouse is calling in stating that they need the Rx paroxetine (PAXIL-CR) 37.5 MG to be an new RX without the manufacturer "Avel Peace" removed and let it be whatever manufacturer the pharmacy has in stock.  Pharm:  [facility])

## 2020-05-13 NOTE — Telephone Encounter (Signed)
Paxil rx updated and patient wife aware.

## 2020-06-05 DIAGNOSIS — Z1152 Encounter for screening for COVID-19: Secondary | ICD-10-CM | POA: Diagnosis not present

## 2020-10-18 ENCOUNTER — Encounter (HOSPITAL_COMMUNITY): Payer: Self-pay

## 2020-10-18 ENCOUNTER — Other Ambulatory Visit: Payer: Self-pay

## 2020-10-18 ENCOUNTER — Ambulatory Visit (HOSPITAL_COMMUNITY)
Admission: EM | Admit: 2020-10-18 | Discharge: 2020-10-18 | Disposition: A | Discharge status: Home / Self Care | Payer: BLUE CROSS/BLUE SHIELD | Attending: Family Medicine | Admitting: Family Medicine

## 2020-10-18 DIAGNOSIS — U071 COVID-19: Secondary | ICD-10-CM | POA: Diagnosis not present

## 2020-10-18 DIAGNOSIS — J069 Acute upper respiratory infection, unspecified: Secondary | ICD-10-CM | POA: Diagnosis not present

## 2020-10-18 MED ORDER — LIDOCAINE VISCOUS HCL 2 % MT SOLN: 10.0000 mL | OROMUCOSAL | 0 refills | Status: AC | PRN

## 2020-10-18 MED ORDER — BENZONATATE 200 MG PO CAPS: 200.0000 mg | ORAL_CAPSULE | Freq: Three times a day (TID) | ORAL | 0 refills | Status: DC | PRN

## 2020-10-18 NOTE — ED Triage Notes (Signed)
Pt in with c/o positive at home covid test this morning  Pt c/o runny nose, ST, and cough x 2 days  Pt took zinc and ibuprofen for relief

## 2020-10-18 NOTE — ED Provider Notes (Signed)
Ryan Harris    CSN: [id number] Arrival date & time: 10/18/20  1423      History   Chief Complaint Chief Complaint  Patient presents with  . Sore Throat    + COVID   . Nasal Congestion  . Cough    HPI [name] is a 65 y.o. male.   Presenting today with 2-day history of rhinorrhea, sore throat, cough, fatigue, mild headache.  Denies known fever, chills, chest pain, shortness of breath, abdominal pain, nausea vomiting or diarrhea.  Took a home COVID test this morning that was positive.  So far taking ibuprofen and a zinc supplement with minimal relief.  No known sick contacts recently.  No known history of seasonal allergies, asthma, COPD.     Past Medical History:  Diagnosis Date  . Allergic rhinitis   . Allergy    seasonal  . Cryptogenic stroke (Ryan Harris)   . Depression   . Hx of adenomatous colonic polyps 09/17/2016  . Leukopenia    chronic  . Patent foramen ovale    with R-to-L shunting by TEE 02/2011. s/p successful transcatheter closure of PFO with 69mm Gore septal occluder device 07/29/11 (enrolled in [place] Helix REDUCE trial)    Patient Active Problem List   Diagnosis Date Noted  . History of cardioembolic cerebrovascular accident (CVA) 04/20/2018  . Hx of adenomatous colonic polyps 09/17/2016  . Stroke ([place]) 03/27/2011  . PFO (patent foramen ovale) 03/27/2011  . Depression, recurrent ([place]) 02/01/2007    Past Surgical History:  Procedure Laterality Date  . COLONOSCOPY    . PATENT FORAMEN OVALE CLOSURE  07/29/2011  . PATENT FORAMEN OVALE CLOSURE N/A 07/29/2011   Procedure: PATENT FORAMEN OVALE CLOSURE;  Surgeon: Sherren Mocha, MD;  Location: [facility] CATH LAB;  Service: Cardiovascular;  Laterality: N/A;       Home Medications    Prior to Admission medications   Medication Sig Start Date End Date Taking? Authorizing Provider  benzonatate (TESSALON) 200 MG capsule Take 1 capsule (200 mg total) by mouth 3 (three) times daily as needed for cough.  10/18/20  Yes Volney American, PA-C  lidocaine (XYLOCAINE) 2 % solution Use as directed 10 mLs in the mouth or throat as needed for mouth pain. 10/18/20  Yes Volney American, PA-C  ALPRAZolam Duanne Moron) 0.5 MG tablet Take 1 tablet (0.5 mg total) by mouth daily as needed for anxiety. 03/17/19   Burchette, Alinda Sierras, MD  aspirin EC 81 MG tablet Take 81 mg by mouth every morning.    [provider]  PARoxetine (PAXIL-CR) 37.5 MG 24 hr tablet TAKE 1 TABLET BY MOUTH EVERY MORNING 05/13/20   Burchette, Alinda Sierras, MD  simvastatin (ZOCOR) 10 MG tablet Take 1 tablet (10 mg total) by mouth at bedtime. 03/10/19   Burchette, Alinda Sierras, MD    Family History Family History  Problem Relation Age of Onset  . Heart attack Mother        mild  . Cancer Mother   . Stroke Father   . Coronary artery disease Father   . Heart disease Father   . Coronary artery disease Sister        < 60  -- had stent placement at 72  . Heart disease Sister   . Hypertension Brother   . Stroke Paternal Uncle        x2  . Aneurysm Paternal Uncle   . Heart attack Paternal Uncle   . Colon cancer Neg Hx  Social History Social History   Tobacco Use  . Smoking status: Never Smoker  . Smokeless tobacco: Never Used  Substance Use Topics  . Alcohol use: Yes    Alcohol/week: 1.0 standard drink    Types: 1 Glasses of wine per week    Comment: Rare--beer  . Drug use: No     Allergies   Patient has no known allergies.   Review of Systems Review of Systems HPI  Physical Exam Triage Vital Signs ED Triage Vitals  Enc Vitals Group     BP 10/18/20 1459 (!) 144/80     Pulse Rate 10/18/20 1459 67     Resp 10/18/20 1458 17     Temp 10/18/20 1458 98.8 F (37.1 C)     Temp Source 10/18/20 1458 Oral     SpO2 10/18/20 1459 99 %     Weight --      Height --      Head Circumference --      Peak Flow --      Pain Score 10/18/20 1457 0     Pain Loc --      Pain Edu? --      Excl. in [place]? --    No data  found.  Updated Vital Signs BP (!) 144/80   Pulse 67   Temp 98.8 F (37.1 C) (Oral)   Resp 17   SpO2 99%   Visual Acuity Right Eye Distance:   Left Eye Distance:   Bilateral Distance:    Right Eye Near:   Left Eye Near:    Bilateral Near:     Physical Exam Vitals and nursing note reviewed.  Constitutional:      Appearance: Normal appearance.  HENT:     Head: Atraumatic.     Right Ear: Tympanic membrane normal.     Left Ear: Tympanic membrane normal.     Nose: Rhinorrhea present.     Mouth/Throat:     Mouth: Mucous membranes are moist.     Pharynx: Posterior oropharyngeal erythema present. No oropharyngeal exudate.  Eyes:     Extraocular Movements: Extraocular movements intact.     Conjunctiva/sclera: Conjunctivae normal.  Cardiovascular:     Rate and Rhythm: Normal rate and regular rhythm.  Pulmonary:     Effort: Pulmonary effort is normal. No respiratory distress.     Breath sounds: Normal breath sounds. No wheezing or rales.  Abdominal:     General: Bowel sounds are normal. There is no distension.     Palpations: Abdomen is soft.     Tenderness: There is no abdominal tenderness. There is no guarding.  Musculoskeletal:        General: Normal range of motion.     Cervical back: Normal range of motion and neck supple.  Skin:    General: Skin is warm and dry.  Neurological:     General: No focal deficit present.     Mental Status: He is oriented to person, place, and time.  Psychiatric:        Mood and Affect: Mood normal.        Thought Content: Thought content normal.        Judgment: Judgment normal.      UC Treatments / Results  Labs (all labs ordered are listed, but only abnormal results are displayed) Labs Reviewed - No data to display  EKG   Radiology No results found.  Procedures Procedures (including critical care time)  Medications Ordered in UC Medications -  No data to display  Initial Impression / Assessment and Plan / UC Course   I have reviewed the triage vital signs and the nursing notes.  Pertinent labs & imaging results that were available during my care of the patient were reviewed by me and considered in my medical decision making (see chart for details).     Home test positive for COVID-19, vitals and exam reassuring today.  Will use symptomatic treatment with Tessalon Perles, viscous lidocaine, Coricidin and plain Mucinex as needed.  Strict return precautions given for acutely worsening symptoms.  Final Clinical Impressions(s) / UC Diagnoses   Final diagnoses:  Viral URI with cough  COVID-19   Discharge Instructions   None    ED Prescriptions    Medication Sig Dispense Auth. Provider   benzonatate (TESSALON) 200 MG capsule Take 1 capsule (200 mg total) by mouth 3 (three) times daily as needed for cough. 20 capsule Volney American, PA-C   lidocaine (XYLOCAINE) 2 % solution Use as directed 10 mLs in the mouth or throat as needed for mouth pain. 100 mL Volney American, [place]     PDMP not reviewed this encounter.   Volney American, [place] 10/18/20 [phone]

## 2020-10-19 ENCOUNTER — Telehealth (HOSPITAL_COMMUNITY): Payer: Self-pay | Admitting: Emergency Medicine

## 2020-10-19 NOTE — Telephone Encounter (Signed)
15:45 late entry.  Called patient, verified patient's identity x 2 identifiers.  A second person on phone identified as wife-patient and wife on conference call.  Reportedly patient is feeling worse.  Was given a script for lidocaine and cough medicine.  Wife questioning why he did not receive antiviral.  Spoke to Vickki Muff, NP    Use of antivirals is determined by providers assessment and discretion of individual patients.  Other avenues for treatment in system available.  Provider that saw patient not on duty today and explained providers present would not call in a medication with out seeing the patient.  With worsening symptoms (low grade fever)suggested patient go to ED.    Wife predominantly spoke on phone as patient advocate.    Wife stated she will contact dr Darnell Level swords for further guidance.

## 2021-09-26 ENCOUNTER — Encounter: Payer: Self-pay | Admitting: Family Medicine

## 2021-09-26 ENCOUNTER — Ambulatory Visit (INDEPENDENT_AMBULATORY_CARE_PROVIDER_SITE_OTHER): Payer: 59 | Admitting: Family Medicine

## 2021-09-26 VITALS — BP 150/80 | HR 55 | Temp 97.7°F | Ht 68.31 in | Wt 157.9 lb

## 2021-09-26 DIAGNOSIS — Z23 Encounter for immunization: Secondary | ICD-10-CM | POA: Diagnosis not present

## 2021-09-26 DIAGNOSIS — Z0000 Encounter for general adult medical examination without abnormal findings: Secondary | ICD-10-CM

## 2021-09-26 DIAGNOSIS — Z0282 Encounter for adoption services: Secondary | ICD-10-CM | POA: Diagnosis not present

## 2021-09-26 DIAGNOSIS — Z125 Encounter for screening for malignant neoplasm of prostate: Secondary | ICD-10-CM

## 2021-09-26 LAB — BASIC METABOLIC PANEL
BUN: 13 mg/dL (ref 6–23)
CO2: 29 mEq/L (ref 19–32)
Calcium: 9.5 mg/dL (ref 8.4–10.5)
Chloride: 104 mEq/L (ref 96–112)
Creatinine, Ser: 1.25 mg/dL (ref 0.40–1.50)
GFR: 59.29 mL/min — ABNORMAL LOW (ref >60.00)
Glucose, Bld: 82 mg/dL (ref 70–99)
Potassium: 4.6 mEq/L (ref 3.5–5.1)
Sodium: 142 mEq/L (ref 135–145)

## 2021-09-26 LAB — CBC WITH DIFFERENTIAL/PLATELET
Basophils Absolute: 0.1 10*3/uL (ref 0.0–0.1)
Basophils Relative: 1.3 % (ref 0.0–3.0)
Eosinophils Absolute: 0.4 10*3/uL (ref 0.0–0.7)
Eosinophils Relative: 9.3 % — ABNORMAL HIGH (ref 0.0–5.0)
HCT: 41.3 % (ref 39.0–52.0)
Hemoglobin: 14 g/dL (ref 13.0–17.0)
Lymphocytes Relative: 15.4 % (ref 12.0–46.0)
Lymphs Abs: 0.7 10*3/uL (ref 0.7–4.0)
MCHC: 33.8 g/dL (ref 30.0–36.0)
MCV: 92.1 fl (ref 78.0–100.0)
Monocytes Absolute: 0.3 10*3/uL (ref 0.1–1.0)
Monocytes Relative: 6.6 % (ref 3.0–12.0)
Neutro Abs: 3.1 10*3/uL (ref 1.4–7.7)
Neutrophils Relative %: 67.4 % (ref 43.0–77.0)
Platelets: 315 10*3/uL (ref 150.0–400.0)
RBC: 4.48 Mil/uL (ref 4.22–5.81)
RDW: 13.8 % (ref 11.5–15.5)
WBC: 4.7 10*3/uL (ref 4.0–10.5)

## 2021-09-26 LAB — LIPID PANEL
Cholesterol: 184 mg/dL (ref 0–200)
HDL: 58.7 mg/dL (ref >39.00)
LDL Cholesterol: 113 mg/dL — ABNORMAL HIGH (ref 0–99)
NonHDL: 125.33
Total CHOL/HDL Ratio: 3
Triglycerides: 60 mg/dL (ref 0.0–149.0)
VLDL: 12 mg/dL (ref 0.0–40.0)

## 2021-09-26 LAB — HEPATIC FUNCTION PANEL
ALT: 20 U/L (ref 0–53)
AST: 19 U/L (ref 0–37)
Albumin: 4.7 g/dL (ref 3.5–5.2)
Alkaline Phosphatase: 71 U/L (ref 39–117)
Bilirubin, Direct: 0.2 mg/dL (ref 0.0–0.3)
Total Bilirubin: 1.1 mg/dL (ref 0.2–1.2)
Total Protein: 7.5 g/dL (ref 6.0–8.3)

## 2021-09-26 LAB — TSH: TSH: 1.57 u[IU]/mL (ref 0.35–5.50)

## 2021-09-26 LAB — PSA: PSA: 1.36 ng/mL (ref 0.10–4.00)

## 2021-09-26 MED ORDER — LOSARTAN POTASSIUM 50 MG PO TABS: 50.0000 mg | ORAL_TABLET | Freq: Every day | ORAL | 3 refills | Status: DC

## 2021-09-26 MED ORDER — ROSUVASTATIN CALCIUM 20 MG PO TABS: 20.0000 mg | ORAL_TABLET | Freq: Every day | ORAL | 1 refills | Status: DC

## 2021-09-26 MED ORDER — PAROXETINE HCL ER 37.5 MG PO TB24: ORAL_TABLET | ORAL | 3 refills | Status: DC

## 2021-09-26 MED ORDER — SIMVASTATIN 10 MG PO TABS: 10.0000 mg | ORAL_TABLET | Freq: Every day | ORAL | 3 refills | Status: DC

## 2021-09-26 MED ORDER — ALPRAZOLAM 0.5 MG PO TABS: 0.5000 mg | ORAL_TABLET | Freq: Every day | ORAL | 0 refills | Status: DC | PRN

## 2021-09-26 NOTE — Progress Notes (Signed)
? ?Established Patient Office Visit ? ?Subjective   ?Patient ID: Ryan Harris, male    DOB: 1956-02-09  Age: 66 y.o. MRN: [id number] ? ?Chief Complaint  ?Patient presents with  ? Annual Exam  ? ? ?HPI ? ?Past Surgical History:  ?Procedure Laterality Date  ? COLONOSCOPY    ? PATENT FORAMEN OVALE CLOSURE  07/29/2011  ? PATENT FORAMEN OVALE CLOSURE N/A 07/29/2011  ? Procedure: PATENT FORAMEN OVALE CLOSURE;  Surgeon: Sherren Mocha, MD;  Location: [facility] CATH LAB;  Service: Cardiovascular;  Laterality: N/A;  ? ?Social History  ? ?Socioeconomic History  ? Marital status: Married  ?  Spouse name: Not on file  ? Number of children: 2  ? Years of education: Not on file  ? Highest education level: Not on file  ?Occupational History  ? Occupation: Engineer, building services  ?Tobacco Use  ? Smoking status: Never  ? Smokeless tobacco: Never  ?Substance and Sexual Activity  ? Alcohol use: Yes  ?  Alcohol/week: 1.0 standard drink  ?  Types: 1 Glasses of wine per week  ?  Comment: Rare--beer  ? Drug use: No  ? Sexual activity: Yes  ?Other Topics Concern  ? Not on file  ?Social History Narrative  ? No regular exercise  ? ?Social Determinants of Health  ? ?Financial Resource Strain: Not on file  ?Food Insecurity: Not on file  ?Transportation Needs: Not on file  ?Physical Activity: Not on file  ?Stress: Not on file  ?Social Connections: Not on file  ?Intimate Partner Violence: Not on file  ? ?Family History  ?Problem Relation Age of Onset  ? Heart attack Mother   ?     mild  ? Cancer Mother   ? Stroke Father   ? Coronary artery disease Father   ? Heart disease Father   ? Hypertension Sister   ? Coronary artery disease Sister   ?     < 60  -- had stent placement at 50  ? Heart disease Sister   ? Hypertension Brother   ? Stroke Paternal Uncle   ?     x2  ? Aneurysm Paternal Uncle   ? Heart attack Paternal Uncle   ? Colon cancer Neg Hx   ? ?  ?Here for physical exam.  Remote history of embolic CVA with PFO several years ago.  He does have hyperlipidemia  and is on low-dose simvastatin.  He is on Paxil and has been on this for several years and requesting refills.  Very infrequently uses alprazolam for things like flying. ? ?Currently living in [place].  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  Works mostly from home but does require some travel.  Plans to work a couple more years. ? ?Health maintenance reviewed- ? ?-Needs Prevnar 13.  Has had previous Pneumovax. ?-Needs tetanus booster next year ?-Previous hepatitis C screen negative ?-Has completed Shingrix vaccine ?-Colonoscopy due 2025 ? ?Social history-owns gravel and sand business as above.  He is married and has 2 grown children and 2 grandchildren.  Has never smoked.  Only occasional alcohol.  Usually about 3 or 4 beers per week. ? ?Family history reviewed.  Hypertension and several siblings as well as parents.  Sister with CAD as well as both parents. ?No known family history of diabetes.  Mother had some type of cancer but is not sure which type ? ?Past Medical History:  ?Diagnosis Date  ? Allergic rhinitis   ?  Allergy   ? seasonal  ? Cryptogenic stroke [facility])   ? Depression   ? Hx of adenomatous colonic polyps 09/17/2016  ? Leukopenia   ? chronic  ? Patent foramen ovale   ? with R-to-L shunting by TEE 02/2011. s/p successful transcatheter closure of PFO with 60m Gore septal occluder device 07/29/11 [place]  ? ?Past Surgical History:  ?Procedure Laterality Date  ? COLONOSCOPY    ? PATENT FORAMEN OVALE CLOSURE  07/29/2011  ? PATENT FORAMEN OVALE CLOSURE N/A 07/29/2011  ? Procedure: PATENT FORAMEN OVALE CLOSURE;  Surgeon: MSherren Mocha MD;  [facility];  Service: Cardiovascular;  Laterality: N/A;  ? ? reports that he has never smoked. He has never used smokeless tobacco. He reports current alcohol use of about 1.0 standard drink per week. He reports that he does not use drugs. ?family history includes Aneurysm in his paternal  uncle; Cancer in his mother; Coronary artery disease in his father and sister; Heart attack in his mother and paternal uncle; Heart disease in his father and sister; Hypertension in his brother and sister; Stroke in his father and paternal uncle. ?No Known Allergies ? ?Review of Systems  ?Constitutional:  Negative for chills, fever, malaise/fatigue and weight loss.  ?Eyes:  Negative for blurred vision and double vision.  ?Respiratory:  Negative for cough and shortness of breath.   ?Cardiovascular:  Negative for chest pain.  ?Gastrointestinal:  Negative for abdominal pain, blood in stool, nausea and vomiting.  ?Genitourinary:  Negative for dysuria.  ?Musculoskeletal:  Negative for joint pain.  ?Neurological:  Negative for tremors, seizures and weakness.  ?Psychiatric/Behavioral:  Negative for depression.   ? ?  ?Objective:  ?  ? ?BP (!) 150/80 (BP Location: Left Arm, Patient Position: Sitting, Cuff Size: Normal)   Pulse (!) 55   Temp 97.7 ?F (36.5 ?C) (Oral)   Ht 5' 8.31" (1.735 m)   Wt 157 lb 14.4 oz (71.6 kg)   SpO2 96%   BMI 23.79 kg/m?  ?BP Readings from Last 3 Encounters:  ?09/26/21 (!) 150/80  ?10/18/20 (!) 144/80  ?03/10/19 110/72  ? ?Wt Readings from Last 3 Encounters:  ?09/26/21 157 lb 14.4 oz (71.6 kg)  ?03/10/19 157 lb 8 oz (71.4 kg)  ?04/20/18 160 lb 11.2 oz (72.9 kg)  ? ?  ? ?Physical Exam ?Constitutional:   ?   General: He is not in acute distress. ?   Appearance: He is well-developed.  ?HENT:  ?   Head: Normocephalic and atraumatic.  ?   Right Ear: External ear normal.  ?   Left Ear: External ear normal.  ?Eyes:  ?   Conjunctiva/sclera: Conjunctivae normal.  ?   Pupils: Pupils are equal, round, and reactive to light.  ?Neck:  ?   Thyroid: No thyromegaly.  ?Cardiovascular:  ?   Rate and Rhythm: Normal rate and regular rhythm.  ?   Heart sounds: Normal heart sounds. No murmur heard. ?Pulmonary:  ?   Effort: No respiratory distress.  ?   Breath sounds: No wheezing or rales.  ?Abdominal:  ?    General: Bowel sounds are normal. There is no distension.  ?   Palpations: Abdomen is soft. There is no mass.  ?   Tenderness: There is no abdominal tenderness. There is no guarding or rebound.  ?Musculoskeletal:  ?   Cervical back: Normal range of motion and neck supple.  ?   Right lower leg: No edema.  ?  Left lower leg: No edema.  ?Lymphadenopathy:  ?   Cervical: No cervical adenopathy.  ?Skin: ?   Findings: No rash.  ?Neurological:  ?   Mental Status: He is alert and oriented to person, place, and time.  ?   Cranial Nerves: No cranial nerve deficit.  ?Psychiatric:     ?   Mood and Affect: Mood normal.  ? ? ? ?No results found for any visits on 09/26/21. ? ?Last lipids ?Lab Results  ?Component Value Date  ? CHOL 153 03/10/2019  ? HDL 51.80 03/10/2019  ? [place] 92 03/10/2019  ? TRIG 45.0 03/10/2019  ? CHOLHDL 3 03/10/2019  ? ?  ? ?The ASCVD Risk score (Arnett DK, et al., 2019) failed to calculate for the following reasons: ?  The patient has a prior MI or stroke diagnosis ? ?  ?Assessment & Plan:  ? ?Patient here for physical exam.  Remote history of embolic CVA.  Does have significant elevated blood pressure today as below with a couple of other elevated readings recently. ? ? ?-Obtain screening labs ?-Blood pressure up and we recommended starting losartan 50 mg daily.  We have recommended follow-up here within a month if possible.  Because of his current work schedule and living in [place] may be possibly 2 months before he is here. ?-Continue annual flu vaccine ?-Needs tetanus booster next year ?-Refilled his regular medications for 1 year. ? ?Return in about 2 months (around 11/26/2021).  ? ? ?Carolann Littler, MD ? ?

## 2021-09-26 NOTE — Addendum Note (Signed)
Addended by: Nilda Riggs on: 09/26/2021 04:24 PM ? ? Modules accepted: Orders ? ?

## 2022-01-27 ENCOUNTER — Telehealth: Payer: Self-pay | Admitting: Family Medicine

## 2022-01-27 NOTE — Telephone Encounter (Signed)
Patient's wife informed of the message and expressed understanding

## 2022-01-27 NOTE — Telephone Encounter (Signed)
Pt wife stacy who is a nurse  is calling and she is concern since he has started losartan and rosuvastatin (CRESTOR) 20 MG tablet he has been having fatigue. 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 Please advise

## 2022-03-16 ENCOUNTER — Other Ambulatory Visit: Payer: Self-pay | Admitting: Family Medicine

## 2022-04-28 ENCOUNTER — Ambulatory Visit (INDEPENDENT_AMBULATORY_CARE_PROVIDER_SITE_OTHER): Payer: 59 | Admitting: Family Medicine

## 2022-04-28 ENCOUNTER — Encounter: Payer: Self-pay | Admitting: Family Medicine

## 2022-04-28 VITALS — BP 136/70 | HR 65 | Temp 98.2°F | Ht 68.31 in | Wt 167.1 lb

## 2022-04-28 DIAGNOSIS — I10 Essential (primary) hypertension: Secondary | ICD-10-CM

## 2022-04-28 DIAGNOSIS — E785 Hyperlipidemia, unspecified: Secondary | ICD-10-CM

## 2022-04-28 DIAGNOSIS — Z23 Encounter for immunization: Secondary | ICD-10-CM

## 2022-04-28 NOTE — Progress Notes (Signed)
Established Patient Office Visit  Subjective   Patient ID: Ryan Harris, male    DOB: 1955/10/18  Age: 66 y.o. MRN: [id number]  Chief Complaint  Patient presents with   Medication Consultation    HPI   Nial lives in [place] and now is working here this week.  We usually see him in May for physical.  Last May his LDL cholesterol was 113.  Prior history of cardioembolic CVA.  We had recommended switch from simvastatin to Crestor which he is tolerating well with no side effect.  He is not fasting today.  His blood pressure remained stable on losartan 50 mg daily.  He takes Paxil CR 37.5 mg daily for history of recurrent depression.  Symptoms stable.  Currently works from home mostly but has to travel occasionally with work.  Past Medical History:  Diagnosis Date   Allergic rhinitis    Allergy    seasonal   Cryptogenic stroke ([place])    Depression    Hx of adenomatous colonic polyps 09/17/2016   Leukopenia    chronic   Patent foramen ovale    with R-to-L shunting by TEE 02/2011. s/p successful transcatheter closure of PFO with 61m Gore septal occluder device 07/29/11 (enrolled in Gore Helix REDUCE trial)   Past Surgical History:  Procedure Laterality Date   COLONOSCOPY     PATENT FORAMEN OVALE CLOSURE  07/29/2011   PATENT FORAMEN OVALE CLOSURE N/A 07/29/2011   Procedure: PATENT FORAMEN OVALE CLOSURE;  Surgeon: MSherren Mocha MD;  [facility];  Service: Cardiovascular;  Laterality: N/A;    reports that he has never smoked. He has never used smokeless tobacco. He reports current alcohol use of about 1.0 standard drink of alcohol per week. He reports that he does not use drugs. family history includes Aneurysm in his paternal uncle; Cancer in his mother; Coronary artery disease in his father and sister; Heart attack in his mother and paternal uncle; Heart disease in his father and sister; Hypertension in his brother and sister; Stroke in his father and paternal uncle. No Known  Allergies  Review of Systems  Constitutional:  Negative for malaise/fatigue.  Eyes:  Negative for blurred vision.  Respiratory:  Negative for shortness of breath.   Cardiovascular:  Negative for chest pain.  Neurological:  Negative for dizziness, weakness and headaches.      Objective:     BP 136/70 (BP Location: Left Arm, Patient Position: Sitting, Cuff Size: Normal)   Pulse 65   Temp 98.2 F (36.8 C) (Oral)   Ht 5' 8.31" (1.735 m)   Wt 167 lb 1.6 oz (75.8 kg)   SpO2 100%   BMI 25.18 kg/m    Physical Exam Constitutional:      Appearance: He is well-developed.  Neck:     Thyroid: No thyromegaly.  Cardiovascular:     Rate and Rhythm: Normal rate and regular rhythm.  Pulmonary:     Effort: Pulmonary effort is normal. No respiratory distress.     Breath sounds: Normal breath sounds. No wheezing or rales.  Musculoskeletal:     Cervical back: Neck supple.  Neurological:     Mental Status: He is alert and oriented to person, place, and time.      No results found for any visits on 04/28/22.    The ASCVD Risk score (Arnett DK, et al., 2019) failed to calculate for the following reasons:   The patient has a prior MI or stroke diagnosis    Assessment &  Plan:   #1 hyperlipidemia.  Patient recently switched from simvastatin to Crestor.  He is not fasting today.  Return for fasting lipid and hepatic panel.  Continue Crestor 20 mg daily.  #2 hypertension stable.  Continue losartan 50 mg daily.  Set up complete physical for sometime in May -Flu vaccine given  No follow-ups on file.    Carolann Littler, MD

## 2022-06-16 ENCOUNTER — Other Ambulatory Visit: Payer: Self-pay | Admitting: Family Medicine

## 2022-06-24 ENCOUNTER — Other Ambulatory Visit: Payer: Self-pay | Admitting: Family Medicine

## 2022-06-25 NOTE — Telephone Encounter (Signed)
Noted  

## 2022-09-04 ENCOUNTER — Other Ambulatory Visit: Payer: Self-pay | Admitting: Family Medicine
